# Patient Record
Sex: Female | Born: 1990 | Race: White | Hispanic: No | Marital: Single | Smoking: Never smoker
Health system: Southern US, Community
[De-identification: ages and names within clinical notes are randomized; demographics above are authoritative.]

## PROBLEM LIST (undated history)

## (undated) DIAGNOSIS — R87629 Unspecified abnormal cytological findings in specimens from vagina: Secondary | ICD-10-CM

## (undated) HISTORY — PX: COLPOSCOPY: SHX161

## (undated) HISTORY — DX: Unspecified abnormal cytological findings in specimens from vagina: R87.629

---

## 2018-07-13 ENCOUNTER — Other Ambulatory Visit: Payer: Self-pay | Admitting: Nurse Practitioner

## 2018-07-13 ENCOUNTER — Ambulatory Visit
Admission: RE | Admit: 2018-07-13 | Discharge: 2018-07-13 | Disposition: A | Discharge status: Home / Self Care | Payer: No Typology Code available for payment source | Source: Ambulatory Visit | Attending: Nurse Practitioner | Admitting: Nurse Practitioner

## 2018-07-13 ENCOUNTER — Other Ambulatory Visit: Payer: Self-pay

## 2018-07-13 DIAGNOSIS — Z021 Encounter for pre-employment examination: Secondary | ICD-10-CM

## 2019-03-08 ENCOUNTER — Other Ambulatory Visit: Payer: No Typology Code available for payment source

## 2019-09-26 IMAGING — CR CHEST  1 VIEW
1 series · 1 of 1 positions shown · non-contrast
Comparison: None.

CLINICAL DATA: Pre-employment examination

EXAM:
CHEST  1 VIEW

[w chest pa]
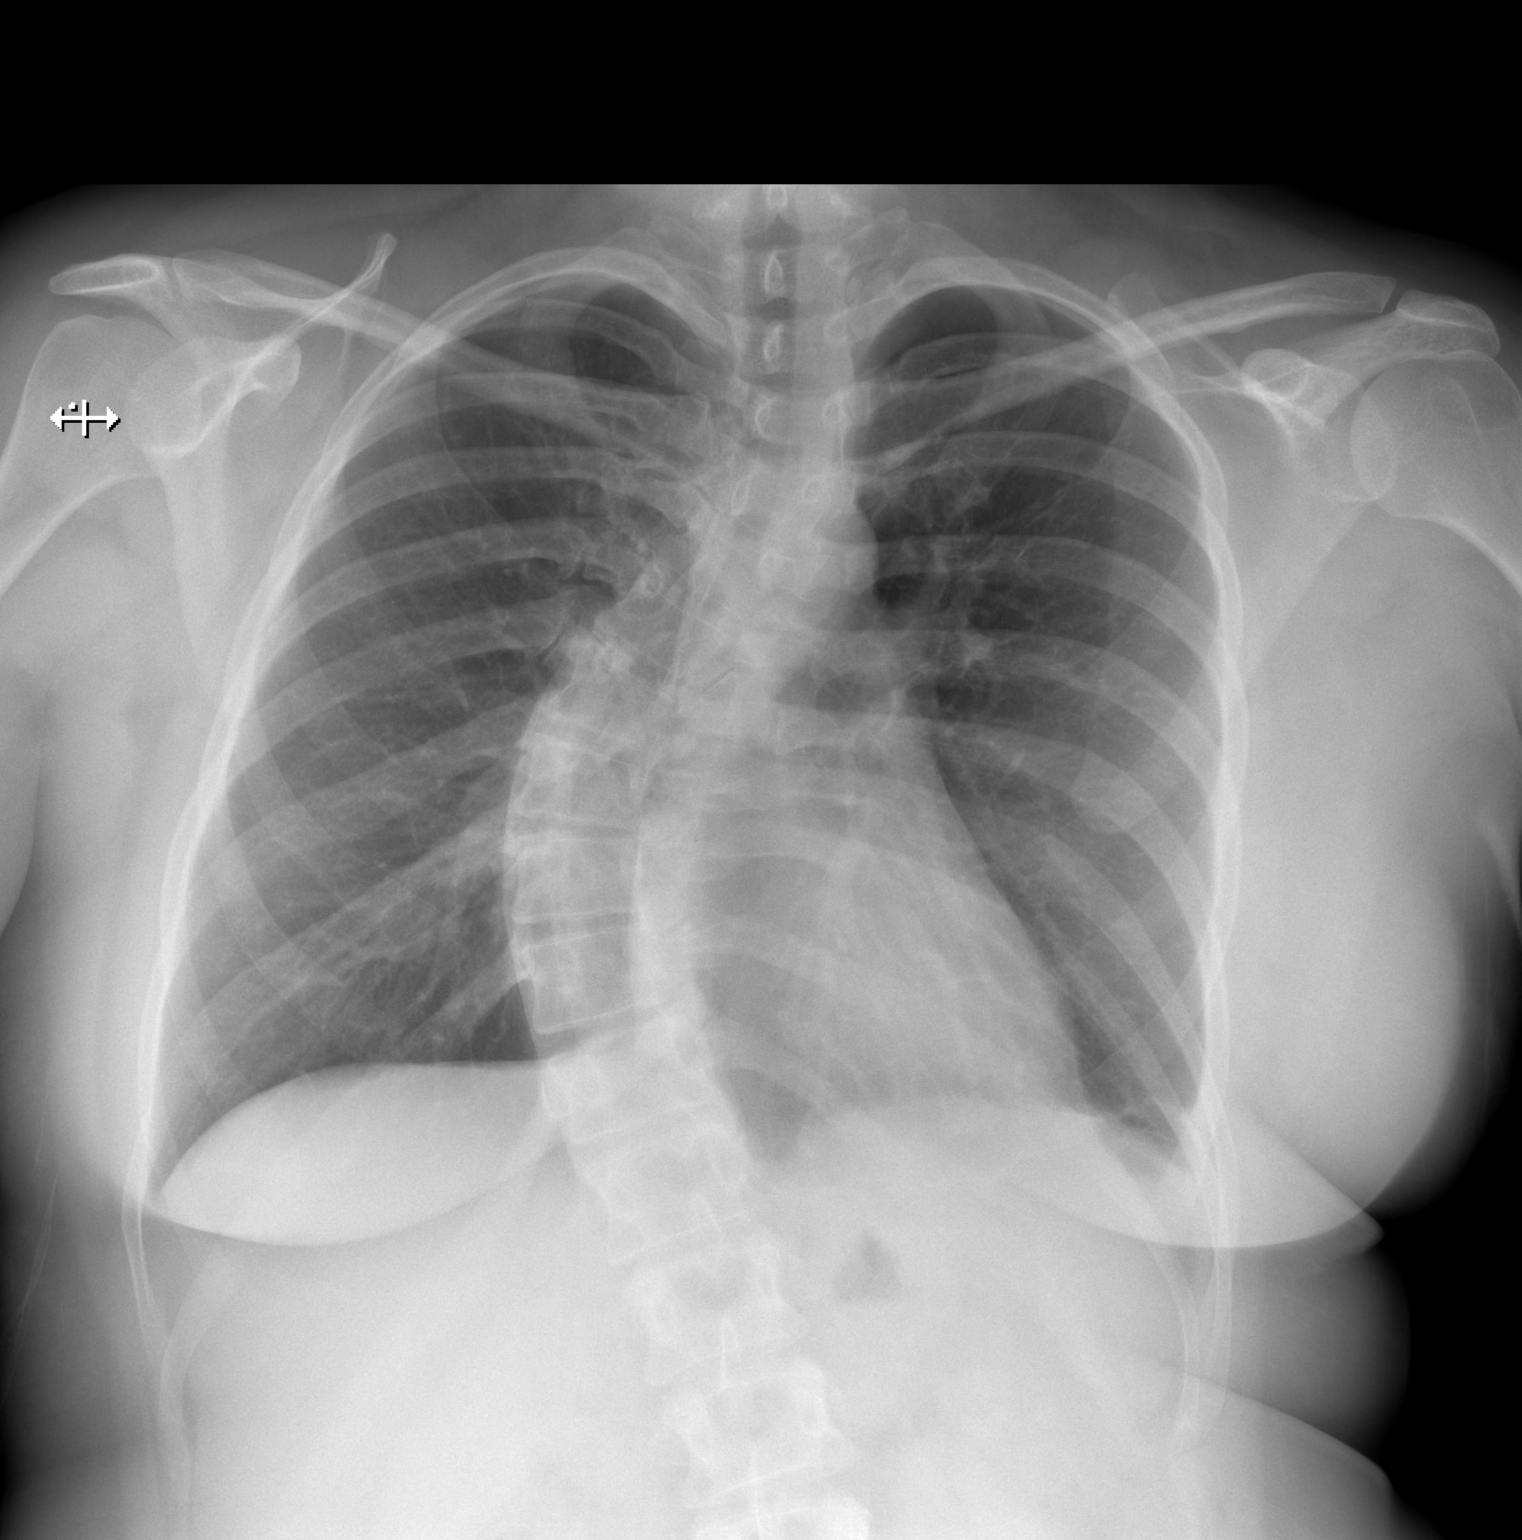

[1 of 1 positions shown; findings below may reference images not displayed]

FINDINGS: No focal opacity or pleural effusion. Normal heart size. No
pneumothorax. Moderate-to-marked dextroscoliosis of the spine.
IMPRESSION: No active disease.  Thoracic scoliosis

## 2020-02-28 ENCOUNTER — Other Ambulatory Visit: Payer: Self-pay

## 2020-02-28 ENCOUNTER — Other Ambulatory Visit (HOSPITAL_COMMUNITY)
Admission: RE | Admit: 2020-02-28 | Discharge: 2020-02-28 | Disposition: A | Discharge status: Home / Self Care | Payer: 59 | Source: Ambulatory Visit | Attending: Advanced Practice Midwife | Admitting: Advanced Practice Midwife

## 2020-02-28 ENCOUNTER — Ambulatory Visit (INDEPENDENT_AMBULATORY_CARE_PROVIDER_SITE_OTHER): Payer: 59 | Admitting: Advanced Practice Midwife

## 2020-02-28 ENCOUNTER — Encounter: Payer: Self-pay | Admitting: Advanced Practice Midwife

## 2020-02-28 VITALS — BP 112/74 | HR 84 | Ht 63.0 in | Wt 183.0 lb

## 2020-02-28 DIAGNOSIS — Z01419 Encounter for gynecological examination (general) (routine) without abnormal findings: Secondary | ICD-10-CM | POA: Diagnosis not present

## 2020-02-28 DIAGNOSIS — Z793 Long term (current) use of hormonal contraceptives: Secondary | ICD-10-CM | POA: Diagnosis not present

## 2020-02-28 DIAGNOSIS — N946 Dysmenorrhea, unspecified: Secondary | ICD-10-CM

## 2020-02-28 MED ORDER — NORGESTIMATE-ETH ESTRADIOL 0.25-35 MG-MCG PO TABS: 1.0000 | ORAL_TABLET | Freq: Every day | ORAL | 0 refills | Status: DC

## 2020-02-28 NOTE — Progress Notes (Signed)
Last pap around 11 years   Has heavy cycles not interested in birth control

## 2020-02-28 NOTE — Progress Notes (Signed)
GYNECOLOGY PROBLEM CARE ENCOUNTER NOTE  History:     Zarinah Condron is a 30 y.o. [id number] female here to establish care and receive information about heavy and painful periods.  Current complaints: 4 days of bleeding with each cycle, first two days consistently "hell".  During that time she experiences fatigue, saturates her pad in about one hour, and bleeding is sometimes so heavy she cannot wear a tampon. She was previously advised to consider NSAIDs and contraception for management. Denies discharge, pelvic pain, problems with intercourse or other gynecologic concerns.    Long-term female partner, considering TTC but not sure of timing. S/p diagnosis of bipolar disorder 8-9 years ago but has not been on medication in 4-5.    Gynecologic History Patient's last menstrual period was 02/11/2020 (exact date). Contraception: condoms Last Pap: 2011 per patient. Results were: normal with negative HPV  Obstetric History OB History  Gravida Para Term Preterm AB Living  1       1    SAB IAB Ectopic Multiple Live Births    1          # Outcome Date GA Lbr Len/2nd Weight Sex Delivery Anes PTL Lv  1 IAB 2011            Past Medical History:  Diagnosis Date  . Vaginal Pap smear, abnormal     Past Surgical History:  Procedure Laterality Date  . COLPOSCOPY      No current outpatient medications on file prior to visit.   No current facility-administered medications on file prior to visit.    No Known Allergies  Social History:  reports that she has never smoked. She has never used smokeless tobacco. She reports previous alcohol use. She reports previous drug use.  Family History  Problem Relation Age of Onset  . Breast cancer Paternal Grandmother   . Breast cancer Maternal Grandmother   . Ovarian cancer Mother     The following portions of the patient's history were reviewed and updated as appropriate: allergies, current medications, past family history, past medical  history, past social history, past surgical history and problem list.  Review of Systems Pertinent items noted in HPI and remainder of comprehensive ROS otherwise negative.  Physical Exam:  BP 112/74   Pulse 84   Ht 5\' 3"  (1.6 m)   Wt 183 lb (83 kg)   LMP 02/11/2020 (Exact Date)   BMI 32.42 kg/m  CONSTITUTIONAL: Well-developed, well-nourished female in no acute distress.  HENT:  Normocephalic, atraumatic, External right and left ear normal.  EYES: Conjunctivae and EOM are normal. Pupils are equal, round, and reactive to light. No scleral icterus.  NECK: Normal range of motion, supple, no masses.  Normal thyroid.  SKIN: Skin is warm and dry. No rash noted. Not diaphoretic. No erythema. No pallor. MUSCULOSKELETAL: Normal range of motion. No tenderness.  No cyanosis, clubbing, or edema.   NEUROLOGIC: Alert and oriented to person, place, and time. Normal reflexes, muscle tone coordination.  PSYCHIATRIC: Normal mood and affect. Normal behavior. Normal judgment and thought content. CARDIOVASCULAR: Normal heart rate noted, regular rhythm RESPIRATORY: Clear to auscultation bilaterally. Effort and breath sounds normal, no problems with respiration noted. BREASTS: Symmetric in size. No masses, tenderness, skin changes, nipple drainage, or lymphadenopathy bilaterally. Performed in the presence of a chaperone. ABDOMEN: Soft, no distention noted.  No tenderness, rebound or guarding.  PELVIC: Normal appearing external genitalia and urethral meatus; normal appearing vaginal mucosa and cervix.  No abnormal discharge noted.  Pap smear obtained.  Normal uterine size, no other palpable masses, no uterine or adnexal tenderness.  Performed in the presence of a chaperone.   Assessment and Plan:    1. Well woman exam with routine gynecological exam - No concerning findings on physical exam - New to [facility], needs PCP. Referred to [place] [facility] - Cytology - PAP - CBC - TSH - Hemoglobin  A1c - Comprehensive metabolic panel - Lipid panel  2. Dysmenorrhea treated with oral contraceptive - Advised Motrin 800 mg q 8 hours if heavy bleeding - Reviewed contraception options for management of AUB/Dysmenorrhea - norgestimate-ethinyl estradiol (ORTHO-CYCLEN) 0.25-35 MG-MCG tablet; Take 1 tablet by mouth daily.  Dispense: 60 tablet; Refill: 0  Will follow up results of pap smear and manage accordingly. Routine preventative health maintenance measures emphasized. Please refer to After Visit Summary for other counseling recommendations.      Clayton Bibles, MSN, CNM Certified Nurse Midwife, [facility] for [employer], [facility] 02/28/20 3:33 PM

## 2020-02-29 LAB — COMPREHENSIVE METABOLIC PANEL
ALT: 24 IU/L (ref 0–32)
AST: 17 IU/L (ref 0–40)
Albumin/Globulin Ratio: 2.2 (ref 1.2–2.2)
Albumin: 4.6 g/dL (ref 3.8–4.8)
Alkaline Phosphatase: 51 IU/L (ref 44–121)
BUN/Creatinine Ratio: 12 (ref 9–23)
BUN: 9 mg/dL (ref 6–20)
Bilirubin Total: 0.3 mg/dL (ref 0.0–1.2)
CO2: 23 mmol/L (ref 20–29)
Calcium: 9.1 mg/dL (ref 8.7–10.2)
Chloride: 104 mmol/L (ref 96–106)
Creatinine, Ser: 0.75 mg/dL (ref 0.57–1.00)
GFR calc Af Amer: 123 mL/min/{1.73_m2} (ref >59)
GFR calc non Af Amer: 107 mL/min/{1.73_m2} (ref >59)
Globulin, Total: 2.1 g/dL (ref 1.5–4.5)
Glucose: 91 mg/dL (ref 65–99)
Potassium: 4.3 mmol/L (ref 3.5–5.2)
Sodium: 141 mmol/L (ref 134–144)
Total Protein: 6.7 g/dL (ref 6.0–8.5)

## 2020-02-29 LAB — LIPID PANEL
Chol/HDL Ratio: 3.5 ratio (ref 0.0–4.4)
Cholesterol, Total: 189 mg/dL (ref 100–199)
HDL: 54 mg/dL (ref >39)
LDL Chol Calc (NIH): 117 mg/dL — ABNORMAL HIGH (ref 0–99)
Triglycerides: 99 mg/dL (ref 0–149)
VLDL Cholesterol Cal: 18 mg/dL (ref 5–40)

## 2020-02-29 LAB — CBC
Hematocrit: 39.8 % (ref 34.0–46.6)
Hemoglobin: 13.3 g/dL (ref 11.1–15.9)
MCH: 30.2 pg (ref 26.6–33.0)
MCHC: 33.4 g/dL (ref 31.5–35.7)
MCV: 90 fL (ref 79–97)
Platelets: 360 10*3/uL (ref 150–450)
RBC: 4.41 x10E6/uL (ref 3.77–5.28)
RDW: 12.3 % (ref 11.7–15.4)
WBC: 8.2 10*3/uL (ref 3.4–10.8)

## 2020-02-29 LAB — TSH: TSH: 0.679 u[IU]/mL (ref 0.450–4.500)

## 2020-02-29 LAB — HEMOGLOBIN A1C
Est. average glucose Bld gHb Est-mCnc: 103 mg/dL
Hgb A1c MFr Bld: 5.2 % (ref 4.8–5.6)

## 2020-03-04 LAB — CYTOLOGY - PAP
Comment: NEGATIVE
Diagnosis: NEGATIVE
High risk HPV: NEGATIVE

## 2020-04-16 ENCOUNTER — Encounter: Payer: Self-pay | Admitting: Radiology

## 2020-04-17 ENCOUNTER — Ambulatory Visit (INDEPENDENT_AMBULATORY_CARE_PROVIDER_SITE_OTHER): Payer: 59 | Admitting: *Deleted

## 2020-04-17 ENCOUNTER — Other Ambulatory Visit: Payer: Self-pay

## 2020-04-17 VITALS — BP 113/77 | HR 90

## 2020-04-17 DIAGNOSIS — R3915 Urgency of urination: Secondary | ICD-10-CM

## 2020-04-17 DIAGNOSIS — N3000 Acute cystitis without hematuria: Secondary | ICD-10-CM

## 2020-04-17 LAB — POCT URINALYSIS DIPSTICK: Nitrite, UA: POSITIVE

## 2020-04-17 MED ORDER — SULFAMETHOXAZOLE-TRIMETHOPRIM 800-160 MG PO TABS
1.0000 | ORAL_TABLET | Freq: Two times a day (BID) | ORAL | 0 refills | Status: DC
Start: 2020-04-17 — End: 2020-05-30

## 2020-04-17 NOTE — Progress Notes (Signed)
Attestation of Attending Supervision of clinical support staff: I agree with the care provided to this patient and was available for any consultation.  I have reviewed the RN's note and chart. I was available for consult and to see the patient if needed.   Kimberly Niles Newton, MD, MPH, ABFM Attending Physician [facility]  

## 2020-04-17 NOTE — Progress Notes (Signed)
SUBJECTIVE: Suzanne Gonzales is a 30 y.o. female who complains of urinary frequency, urgency and dysuria x 7 days, without flank pain, fever, chills, or abnormal vaginal discharge or bleeding.   OBJECTIVE: Appears well, in no apparent distress.  Vital signs are normal. Urine dipstick shows positive for RBC's, positive for nitrates and positive for leukocytes.    ASSESSMENT: Dysuria  PLAN: Treatment per orders.  Call or return to clinic prn if these symptoms worsen or fail to improve as anticipated.

## 2020-04-20 LAB — URINE CULTURE

## 2020-04-22 ENCOUNTER — Telehealth: Payer: Self-pay | Admitting: *Deleted

## 2020-04-22 MED ORDER — CIPROFLOXACIN HCL 500 MG PO TABS: 500.0000 mg | ORAL_TABLET | Freq: Two times a day (BID) | ORAL | 0 refills | Status: DC

## 2020-04-22 NOTE — Addendum Note (Signed)
Addended by: Geanie Berlin on: 04/22/2020 12:15 PM   Modules accepted: Orders

## 2020-04-22 NOTE — Telephone Encounter (Signed)
Pt informed of results and new medication sent in.

## 2020-04-22 NOTE — Telephone Encounter (Signed)
-----   Message from Federico Flake, MD sent at 04/22/2020 12:15 PM EST ----- Patient placed on Bactrim empirically at office visit. Culture shows resistance to bactrim. Sensitive to cipro. Sent in Rx.

## 2020-04-30 ENCOUNTER — Other Ambulatory Visit: Payer: Self-pay | Admitting: *Deleted

## 2020-04-30 DIAGNOSIS — N946 Dysmenorrhea, unspecified: Secondary | ICD-10-CM

## 2020-04-30 DIAGNOSIS — Z793 Long term (current) use of hormonal contraceptives: Secondary | ICD-10-CM

## 2020-04-30 MED ORDER — NORGESTIMATE-ETH ESTRADIOL 0.25-35 MG-MCG PO TABS: 1.0000 | ORAL_TABLET | Freq: Every day | ORAL | 0 refills | Status: DC

## 2020-05-30 ENCOUNTER — Other Ambulatory Visit: Payer: Self-pay

## 2020-05-30 ENCOUNTER — Ambulatory Visit (INDEPENDENT_AMBULATORY_CARE_PROVIDER_SITE_OTHER): Payer: 59 | Admitting: Family Medicine

## 2020-05-30 ENCOUNTER — Encounter: Payer: Self-pay | Admitting: Family Medicine

## 2020-05-30 VITALS — BP 109/75 | HR 78 | Ht 63.0 in | Wt 185.8 lb

## 2020-05-30 DIAGNOSIS — Z793 Long term (current) use of hormonal contraceptives: Secondary | ICD-10-CM

## 2020-05-30 DIAGNOSIS — N946 Dysmenorrhea, unspecified: Secondary | ICD-10-CM | POA: Diagnosis not present

## 2020-05-30 DIAGNOSIS — F419 Anxiety disorder, unspecified: Secondary | ICD-10-CM | POA: Diagnosis not present

## 2020-05-30 MED ORDER — PROPRANOLOL HCL 20 MG PO TABS: 20.0000 mg | ORAL_TABLET | Freq: Two times a day (BID) | ORAL | 3 refills | Status: AC

## 2020-05-30 NOTE — Assessment & Plan Note (Signed)
Has test taking anxiety--given refill for propranolol.

## 2020-05-30 NOTE — Assessment & Plan Note (Signed)
Improved, and will continue. If we do not get the desired effect, can consider skipping placebos.

## 2020-05-30 NOTE — Progress Notes (Signed)
   Subjective:    Patient ID: Suzanne Gonzales is a 30 y.o. female presenting with Follow-up  on 05/30/2020  HPI: Patient with pelvic pain and dysmenorrhea. Placed on OCs. Reports that pain is improving every month. Cycles are short and improving.  Review of Systems  Constitutional: Negative for chills and fever.  Respiratory: Negative for shortness of breath.   Cardiovascular: Negative for chest pain.  Gastrointestinal: Negative for abdominal pain, nausea and vomiting.  Genitourinary: Negative for dysuria.  Skin: Negative for rash.      Objective:    BP 109/75   Pulse 78   Ht 5\' 3"  (1.6 m)   Wt 185 lb 12.8 oz (84.3 kg)   BMI 32.91 kg/m  Physical Exam Constitutional:      General: She is not in acute distress.    Appearance: She is well-developed.  HENT:     Head: Normocephalic and atraumatic.  Eyes:     General: No scleral icterus. Cardiovascular:     Rate and Rhythm: Normal rate.  Pulmonary:     Effort: Pulmonary effort is normal.  Abdominal:     Palpations: Abdomen is soft.  Musculoskeletal:     Cervical back: Neck supple.  Skin:    General: Skin is warm and dry.  Neurological:     Mental Status: She is alert and oriented to person, place, and time.         Assessment & Plan:   Problem List Items Addressed This Visit      Unprioritized   Dysmenorrhea treated with oral contraceptive    Improved, and will continue. If we do not get the desired effect, can consider skipping placebos.      Anxiety - Primary    Has test taking anxiety--given refill for propranolol.      Relevant Medications   propranolol (INDERAL) 20 MG tablet      Return in about 3 months (around 08/29/2020).  10/30/2020 05/30/2020 4:50 PM

## 2020-06-10 ENCOUNTER — Other Ambulatory Visit: Payer: Self-pay | Admitting: *Deleted

## 2020-06-10 DIAGNOSIS — N946 Dysmenorrhea, unspecified: Secondary | ICD-10-CM

## 2020-06-10 MED ORDER — NORGESTIMATE-ETH ESTRADIOL 0.25-35 MG-MCG PO TABS: 1.0000 | ORAL_TABLET | Freq: Every day | ORAL | 5 refills | Status: DC

## 2020-07-16 ENCOUNTER — Encounter: Payer: Self-pay | Admitting: Radiology

## 2021-01-03 ENCOUNTER — Encounter: Payer: Self-pay | Admitting: Nurse Practitioner

## 2021-01-03 ENCOUNTER — Telehealth (INDEPENDENT_AMBULATORY_CARE_PROVIDER_SITE_OTHER): Payer: 59 | Admitting: Nurse Practitioner

## 2021-01-03 VITALS — Wt 188.0 lb

## 2021-01-03 DIAGNOSIS — Z7689 Persons encountering health services in other specified circumstances: Secondary | ICD-10-CM

## 2021-01-03 DIAGNOSIS — E669 Obesity, unspecified: Secondary | ICD-10-CM

## 2021-01-03 NOTE — Progress Notes (Signed)
Wt 188 lb (85.3 kg)   LMP 12/01/2020   BMI 33.30 kg/m    Subjective:    Patient ID: Suzanne Gonzales, female    DOB: [birth year], 30 y.o.   MRN: [id number]  HPI: Suzanne Gonzales is a 30 y.o. female  Chief Complaint  Patient presents with   New Patient (Initial Visit)   Patient presents to clinic to establish care with new PCP.  Patient denies any significant past medical history.   Patient denies a history of: Hypertension, Elevated Cholesterol, Diabetes, Thyroid problems, Depression, Anxiety, Neurological problems, and Abdominal problems.   Patient states she would like to discuss her weight.  She would like to discuss medication options for weight loss.    Denies HA, CP, SOB, dizziness, palpitations, visual changes, and lower extremity swelling.   Active Ambulatory Problems    Diagnosis Date Noted   Dysmenorrhea treated with oral contraceptive 05/30/2020   Anxiety 05/30/2020   Obesity (BMI 30-39.9) 01/03/2021   Resolved Ambulatory Problems    Diagnosis Date Noted   No Resolved Ambulatory Problems   Past Medical History:  Diagnosis Date   Vaginal Pap smear, abnormal    Past Surgical History:  Procedure Laterality Date   COLPOSCOPY     Family History  Problem Relation Age of Onset   Ovarian cancer Mother    Breast cancer Maternal Grandmother    Breast cancer Paternal Grandmother     Allergies and medications reviewed and updated.  Review of Systems  Eyes:  Negative for visual disturbance.  Respiratory:  Negative for cough, chest tightness and shortness of breath.   Cardiovascular:  Negative for chest pain, palpitations and leg swelling.  Neurological:  Negative for dizziness and headaches.   Per HPI unless specifically indicated above     Objective:    Wt 188 lb (85.3 kg)   LMP 12/01/2020   BMI 33.30 kg/m   Wt Readings from Last 3 Encounters:  01/03/21 188 lb (85.3 kg)  05/30/20 185 lb 12.8 oz (84.3 kg)  02/28/20 183 lb (83 kg)     Physical Exam Vitals and nursing note reviewed.  HENT:     Head: Normocephalic.     Right Ear: Hearing normal.     Left Ear: Hearing normal.     Nose: Nose normal.  Eyes:     Pupils: Pupils are equal, round, and reactive to light.  Pulmonary:     Effort: Pulmonary effort is normal. No respiratory distress.  Neurological:     Mental Status: She is alert.  Psychiatric:        Mood and Affect: Mood normal.        Behavior: Behavior normal.        Thought Content: Thought content normal.        Judgment: Judgment normal.    Results for orders placed or performed in visit on 04/17/20  Urine Culture   Specimen: Urine   UR  Result Value Ref Range   Urine Culture, Routine Final report (A)    Organism ID, Bacteria Comment (A)    Antimicrobial Susceptibility Comment   POCT Urinalysis Dipstick  Result Value Ref Range   Color, UA     Clarity, UA     Glucose, UA     Bilirubin, UA     Ketones, UA     Spec Grav, UA     Blood, UA moderate    pH, UA     Protein, UA     Urobilinogen, UA  Nitrite, UA positive    Leukocytes, UA Large (3+) (A) Negative   Appearance     Odor        Assessment & Plan:   Problem List Items Addressed This Visit       Other   Obesity (BMI 30-39.9) - Primary    Chronic.  Will order labs prior discussing medication options. Will follow up in 1 week to discuss lab results and medication options for weight loss.        Other Visit Diagnoses     Encounter for weight management       Relevant Orders   Comp Met (CMET)   CBC w/Diff   TSH   Encounter to establish care            Follow up plan: Return in about 1 week (around 01/10/2021) for Weight Managment.   A total of 30 minutes were spent on this encounter today.  When total time is documented, this includes both the face-to-face and non-face-to-face time personally spent before, during and after the visit on the date of the encounter discussing medical history and weight loss plan of  care..    This visit was completed via MyChart due to the restrictions of the COVID-19 pandemic. All issues as above were discussed and addressed. Physical exam was done as above through visual confirmation on MyChart. If it was felt that the patient should be evaluated in the office, they were directed there. The patient verbally consented to this visit. Location of the patient: Home Location of the provider: Office Those involved with this call:  Provider: Jon Billings, NP CMA: Jodie Echevaria, [place] Desk/Registration: Myrlene Broker This encounter was conducted via video.  I spent 20 dedicated to the care of this patient on the date of this encounter to include previsit review of 30, face to face time with the patient, and post visit ordering of testing.

## 2021-01-03 NOTE — Assessment & Plan Note (Signed)
Chronic.  Will order labs prior discussing medication options. Will follow up in 1 week to discuss lab results and medication options for weight loss.

## 2021-01-06 NOTE — Progress Notes (Signed)
LMOM for pt to call the office to schedule a lab appt at the beginning of the week and a fu for a week.

## 2021-01-07 NOTE — Progress Notes (Signed)
Appointment scheduled.

## 2021-01-13 ENCOUNTER — Other Ambulatory Visit: Payer: Self-pay

## 2021-01-13 ENCOUNTER — Other Ambulatory Visit: Payer: 59

## 2021-01-13 DIAGNOSIS — Z7689 Persons encountering health services in other specified circumstances: Secondary | ICD-10-CM

## 2021-01-14 LAB — CBC WITH DIFFERENTIAL/PLATELET
Basophils Absolute: 0.1 10*3/uL (ref 0.0–0.2)
Basos: 1 %
EOS (ABSOLUTE): 0.2 10*3/uL (ref 0.0–0.4)
Eos: 2 %
Hematocrit: 39.4 % (ref 34.0–46.6)
Hemoglobin: 12.9 g/dL (ref 11.1–15.9)
Immature Grans (Abs): 0 10*3/uL (ref 0.0–0.1)
Immature Granulocytes: 0 %
Lymphocytes Absolute: 3.4 10*3/uL — ABNORMAL HIGH (ref 0.7–3.1)
Lymphs: 37 %
MCH: 29.3 pg (ref 26.6–33.0)
MCHC: 32.7 g/dL (ref 31.5–35.7)
MCV: 90 fL (ref 79–97)
Monocytes Absolute: 0.7 10*3/uL (ref 0.1–0.9)
Monocytes: 8 %
Neutrophils Absolute: 5 10*3/uL (ref 1.4–7.0)
Neutrophils: 52 %
Platelets: 347 10*3/uL (ref 150–450)
RBC: 4.4 x10E6/uL (ref 3.77–5.28)
RDW: 12.4 % (ref 11.7–15.4)
WBC: 9.4 10*3/uL (ref 3.4–10.8)

## 2021-01-14 LAB — COMPREHENSIVE METABOLIC PANEL
ALT: 33 IU/L — ABNORMAL HIGH (ref 0–32)
AST: 19 IU/L (ref 0–40)
Albumin/Globulin Ratio: 2.2 (ref 1.2–2.2)
Albumin: 4.3 g/dL (ref 3.8–4.8)
Alkaline Phosphatase: 65 IU/L (ref 44–121)
BUN/Creatinine Ratio: 19 (ref 9–23)
BUN: 13 mg/dL (ref 6–20)
Bilirubin Total: 0.4 mg/dL (ref 0.0–1.2)
CO2: 23 mmol/L (ref 20–29)
Calcium: 8.9 mg/dL (ref 8.7–10.2)
Chloride: 101 mmol/L (ref 96–106)
Creatinine, Ser: 0.67 mg/dL (ref 0.57–1.00)
Globulin, Total: 2 g/dL (ref 1.5–4.5)
Glucose: 84 mg/dL (ref 70–99)
Potassium: 4.3 mmol/L (ref 3.5–5.2)
Sodium: 138 mmol/L (ref 134–144)
Total Protein: 6.3 g/dL (ref 6.0–8.5)
eGFR: 119 mL/min/{1.73_m2} (ref >59)

## 2021-01-14 LAB — TSH: TSH: 1.25 u[IU]/mL (ref 0.450–4.500)

## 2021-01-14 NOTE — Progress Notes (Signed)
Hi Avonna.  Your blood work looks good.  Please let me know if you have any questions.  I will see you soon at our appt.

## 2021-01-22 ENCOUNTER — Encounter: Payer: Self-pay | Admitting: Nurse Practitioner

## 2021-01-22 ENCOUNTER — Other Ambulatory Visit: Payer: Self-pay

## 2021-01-22 ENCOUNTER — Ambulatory Visit (INDEPENDENT_AMBULATORY_CARE_PROVIDER_SITE_OTHER): Payer: 59 | Admitting: Nurse Practitioner

## 2021-01-22 VITALS — BP 112/59 | HR 70 | Temp 97.8°F | Wt 192.0 lb

## 2021-01-22 DIAGNOSIS — Z7689 Persons encountering health services in other specified circumstances: Secondary | ICD-10-CM

## 2021-01-22 DIAGNOSIS — E669 Obesity, unspecified: Secondary | ICD-10-CM

## 2021-01-22 DIAGNOSIS — F419 Anxiety disorder, unspecified: Secondary | ICD-10-CM | POA: Diagnosis not present

## 2021-01-22 MED ORDER — NALTREXONE-BUPROPION HCL ER 8-90 MG PO TB12: ORAL_TABLET | ORAL | 0 refills | Status: DC

## 2021-01-22 NOTE — Progress Notes (Signed)
BP (!) 112/59   Pulse 70   Temp 97.8 F (36.6 C) (Oral)   Wt 192 lb (87.1 kg)   SpO2 99%   BMI 34.01 kg/m    Subjective:    Patient ID: Suzanne Gonzales, female    DOB: 05-30-1990, 30 y.o.   MRN: [id number]  HPI: Suzanne Gonzales is a 30 y.o. female  Chief Complaint  Patient presents with   Weight Check    WEIGHT MANAGEMENT Patient presents to clinic to start weight loss medication.  Patient has tried diet and exercising on her own and has not been successful in losing weight. She has previously completed blood work prior to starting medication.    Denies HA, CP, SOB, dizziness, palpitations, visual changes, and lower extremity swelling.   Relevant past medical, surgical, family and social history reviewed and updated as indicated. Interim medical history since our last visit reviewed. Allergies and medications reviewed and updated.  Review of Systems  Constitutional:  Positive for unexpected weight change.  Eyes:  Negative for visual disturbance.  Respiratory:  Negative for cough, chest tightness and shortness of breath.   Cardiovascular:  Negative for chest pain, palpitations and leg swelling.  Neurological:  Negative for dizziness and headaches.   Per HPI unless specifically indicated above     Objective:    BP (!) 112/59   Pulse 70   Temp 97.8 F (36.6 C) (Oral)   Wt 192 lb (87.1 kg)   SpO2 99%   BMI 34.01 kg/m   Wt Readings from Last 3 Encounters:  01/22/21 192 lb (87.1 kg)  01/03/21 188 lb (85.3 kg)  05/30/20 185 lb 12.8 oz (84.3 kg)    Physical Exam Vitals and nursing note reviewed.  Constitutional:      General: She is not in acute distress.    Appearance: Normal appearance. She is obese. She is not ill-appearing, toxic-appearing or diaphoretic.  HENT:     Head: Normocephalic.     Right Ear: External ear normal.     Left Ear: External ear normal.     Nose: Nose normal.     Mouth/Throat:     Mouth: Mucous membranes are moist.      Pharynx: Oropharynx is clear.  Eyes:     General:        Right eye: No discharge.        Left eye: No discharge.     Extraocular Movements: Extraocular movements intact.     Conjunctiva/sclera: Conjunctivae normal.     Pupils: Pupils are equal, round, and reactive to light.  Cardiovascular:     Rate and Rhythm: Normal rate and regular rhythm.     Heart sounds: No murmur heard. Pulmonary:     Effort: Pulmonary effort is normal. No respiratory distress.     Breath sounds: Normal breath sounds. No wheezing or rales.  Musculoskeletal:     Cervical back: Normal range of motion and neck supple.  Skin:    General: Skin is warm and dry.     Capillary Refill: Capillary refill takes less than 2 seconds.  Neurological:     General: No focal deficit present.     Mental Status: She is alert and oriented to person, place, and time. Mental status is at baseline.  Psychiatric:        Mood and Affect: Mood normal.        Behavior: Behavior normal.        Thought Content: Thought content normal.  Judgment: Judgment normal.    Results for orders placed or performed in visit on 01/13/21  TSH  Result Value Ref Range   TSH 1.250 0.450 - 4.500 uIU/mL  CBC w/Diff  Result Value Ref Range   WBC 9.4 3.4 - 10.8 x10E3/uL   RBC 4.40 3.77 - 5.28 x10E6/uL   Hemoglobin 12.9 11.1 - 15.9 g/dL   Hematocrit 39.4 34.0 - 46.6 %   MCV 90 79 - 97 fL   MCH 29.3 26.6 - 33.0 pg   MCHC 32.7 31.5 - 35.7 g/dL   RDW 12.4 11.7 - 15.4 %   Platelets 347 150 - 450 x10E3/uL   Neutrophils 52 Not Estab. %   Lymphs 37 Not Estab. %   Monocytes 8 Not Estab. %   Eos 2 Not Estab. %   Basos 1 Not Estab. %   Neutrophils Absolute 5.0 1.4 - 7.0 x10E3/uL   Lymphocytes Absolute 3.4 (H) 0.7 - 3.1 x10E3/uL   Monocytes Absolute 0.7 0.1 - 0.9 x10E3/uL   EOS (ABSOLUTE) 0.2 0.0 - 0.4 x10E3/uL   Basophils Absolute 0.1 0.0 - 0.2 x10E3/uL   Immature Granulocytes 0 Not Estab. %   Immature Grans (Abs) 0.0 0.0 - 0.1 x10E3/uL  Comp  Met (CMET)  Result Value Ref Range   Glucose 84 70 - 99 mg/dL   BUN 13 6 - 20 mg/dL   Creatinine, Ser 0.67 0.57 - 1.00 mg/dL   eGFR 119 >59 mL/min/1.73   BUN/Creatinine Ratio 19 9 - 23   Sodium 138 134 - 144 mmol/L   Potassium 4.3 3.5 - 5.2 mmol/L   Chloride 101 96 - 106 mmol/L   CO2 23 20 - 29 mmol/L   Calcium 8.9 8.7 - 10.2 mg/dL   Total Protein 6.3 6.0 - 8.5 g/dL   Albumin 4.3 3.8 - 4.8 g/dL   Globulin, Total 2.0 1.5 - 4.5 g/dL   Albumin/Globulin Ratio 2.2 1.2 - 2.2   Bilirubin Total 0.4 0.0 - 1.2 mg/dL   Alkaline Phosphatase 65 44 - 121 IU/L   AST 19 0 - 40 IU/L   ALT 33 (H) 0 - 32 IU/L      Assessment & Plan:   Problem List Items Addressed This Visit       Other   Anxiety   Obesity (BMI 30-39.9) - Primary    Will start Contrave for weight loss. Patient would like to try pill option first.  Discussed how to properly use medication and how to titrate.  Discussed side effects and benefits of medication.  Discussed that it is best coupled with healthy diet and exercise habits.  Follow up in 1 month for reevaluation.  Patient understands and agrees with the plan of care.       Relevant Medications   Naltrexone-buPROPion HCl ER 8-90 MG TB12   Other Visit Diagnoses     Encounter for weight management            Follow up plan: Return in about 1 month (around 02/21/2021) for Weight Managment.

## 2021-01-22 NOTE — Assessment & Plan Note (Signed)
Will start Contrave for weight loss. Patient would like to try pill option first.  Discussed how to properly use medication and how to titrate.  Discussed side effects and benefits of medication.  Discussed that it is best coupled with healthy diet and exercise habits.  Follow up in 1 month for reevaluation.  Patient understands and agrees with the plan of care.

## 2021-01-23 ENCOUNTER — Encounter: Payer: Self-pay | Admitting: Radiology

## 2021-02-06 ENCOUNTER — Encounter: Payer: Self-pay | Admitting: Nurse Practitioner

## 2021-02-19 ENCOUNTER — Ambulatory Visit (INDEPENDENT_AMBULATORY_CARE_PROVIDER_SITE_OTHER): Payer: 59 | Admitting: Nurse Practitioner

## 2021-02-19 ENCOUNTER — Encounter: Payer: Self-pay | Admitting: Nurse Practitioner

## 2021-02-19 ENCOUNTER — Other Ambulatory Visit: Payer: Self-pay

## 2021-02-19 VITALS — BP 109/71 | HR 86 | Temp 98.2°F | Wt 188.6 lb

## 2021-02-19 DIAGNOSIS — E669 Obesity, unspecified: Secondary | ICD-10-CM

## 2021-02-19 NOTE — Progress Notes (Signed)
BP 109/71    Pulse 86    Temp 98.2 F (36.8 C) (Oral)    Wt 188 lb 9.6 oz (85.5 kg)    SpO2 99%    BMI 33.41 kg/m    Subjective:    Patient ID: Suzanne Gonzales, female    DOB: Sep 07, 1990, 30 y.o.   MRN: [id number]  HPI: Suzanne Gonzales is a 30 y.o. female  Chief Complaint  Patient presents with   Obesity    1 month f/up     WEIGHT MANAGEMENT Patient presents to clinic to follow up in weight loss. Patient states the first 7-10 days was really good.  She had stopped craving sweets and a lot of food.  She feels like her cravings are coming back and she is hungrier. Patient states the headaches she was getting have improved.  She does have some dry mouth.    Denies HA, CP, SOB, dizziness, palpitations, visual changes, and lower extremity swelling.   Relevant past medical, surgical, family and social history reviewed and updated as indicated. Interim medical history since our last visit reviewed. Allergies and medications reviewed and updated.  Review of Systems  Constitutional:        Weight loss  Eyes:  Negative for visual disturbance.  Respiratory:  Negative for cough, chest tightness and shortness of breath.   Cardiovascular:  Negative for chest pain, palpitations and leg swelling.  Neurological:  Negative for dizziness and headaches.   Per HPI unless specifically indicated above     Objective:    BP 109/71    Pulse 86    Temp 98.2 F (36.8 C) (Oral)    Wt 188 lb 9.6 oz (85.5 kg)    SpO2 99%    BMI 33.41 kg/m   Wt Readings from Last 3 Encounters:  02/19/21 188 lb 9.6 oz (85.5 kg)  01/22/21 192 lb (87.1 kg)  01/03/21 188 lb (85.3 kg)    Physical Exam Vitals and nursing note reviewed.  Constitutional:      General: She is not in acute distress.    Appearance: Normal appearance. She is obese. She is not ill-appearing, toxic-appearing or diaphoretic.  HENT:     Head: Normocephalic.     Right Ear: External ear normal.     Left Ear: External ear normal.      Nose: Nose normal.     Mouth/Throat:     Mouth: Mucous membranes are moist.     Pharynx: Oropharynx is clear.  Eyes:     General:        Right eye: No discharge.        Left eye: No discharge.     Extraocular Movements: Extraocular movements intact.     Conjunctiva/sclera: Conjunctivae normal.     Pupils: Pupils are equal, round, and reactive to light.  Cardiovascular:     Rate and Rhythm: Normal rate and regular rhythm.     Heart sounds: No murmur heard. Pulmonary:     Effort: Pulmonary effort is normal. No respiratory distress.     Breath sounds: Normal breath sounds. No wheezing or rales.  Musculoskeletal:     Cervical back: Normal range of motion and neck supple.  Skin:    General: Skin is warm and dry.     Capillary Refill: Capillary refill takes less than 2 seconds.  Neurological:     General: No focal deficit present.     Mental Status: She is alert and oriented to person, place, and time. Mental  status is at baseline.  Psychiatric:        Mood and Affect: Mood normal.        Behavior: Behavior normal.        Thought Content: Thought content normal.        Judgment: Judgment normal.    Results for orders placed or performed in visit on 01/13/21  TSH  Result Value Ref Range   TSH 1.250 0.450 - 4.500 uIU/mL  CBC w/Diff  Result Value Ref Range   WBC 9.4 3.4 - 10.8 x10E3/uL   RBC 4.40 3.77 - 5.28 x10E6/uL   Hemoglobin 12.9 11.1 - 15.9 g/dL   Hematocrit 39.4 34.0 - 46.6 %   MCV 90 79 - 97 fL   MCH 29.3 26.6 - 33.0 pg   MCHC 32.7 31.5 - 35.7 g/dL   RDW 12.4 11.7 - 15.4 %   Platelets 347 150 - 450 x10E3/uL   Neutrophils 52 Not Estab. %   Lymphs 37 Not Estab. %   Monocytes 8 Not Estab. %   Eos 2 Not Estab. %   Basos 1 Not Estab. %   Neutrophils Absolute 5.0 1.4 - 7.0 x10E3/uL   Lymphocytes Absolute 3.4 (H) 0.7 - 3.1 x10E3/uL   Monocytes Absolute 0.7 0.1 - 0.9 x10E3/uL   EOS (ABSOLUTE) 0.2 0.0 - 0.4 x10E3/uL   Basophils Absolute 0.1 0.0 - 0.2 x10E3/uL   Immature  Granulocytes 0 Not Estab. %   Immature Grans (Abs) 0.0 0.0 - 0.1 x10E3/uL  Comp Met (CMET)  Result Value Ref Range   Glucose 84 70 - 99 mg/dL   BUN 13 6 - 20 mg/dL   Creatinine, Ser 0.67 0.57 - 1.00 mg/dL   eGFR 119 >59 mL/min/1.73   BUN/Creatinine Ratio 19 9 - 23   Sodium 138 134 - 144 mmol/L   Potassium 4.3 3.5 - 5.2 mmol/L   Chloride 101 96 - 106 mmol/L   CO2 23 20 - 29 mmol/L   Calcium 8.9 8.7 - 10.2 mg/dL   Total Protein 6.3 6.0 - 8.5 g/dL   Albumin 4.3 3.8 - 4.8 g/dL   Globulin, Total 2.0 1.5 - 4.5 g/dL   Albumin/Globulin Ratio 2.2 1.2 - 2.2   Bilirubin Total 0.4 0.0 - 1.2 mg/dL   Alkaline Phosphatase 65 44 - 121 IU/L   AST 19 0 - 40 IU/L   ALT 33 (H) 0 - 32 IU/L      Assessment & Plan:   Problem List Items Addressed This Visit       Other   Obesity (BMI 30-39.9) - Primary    Ongoing. Patient has lost 4lbs since starting the medication. She was able to increase to two tabs in the morning and two in the evening this past Sunday.  Praised for weight loss.  Encouraged to follow a diet and exercise regularly to aid in weight loss.  Follow up in 2 months for reevaluation.        Follow up plan: Return in about 2 months (around 04/22/2021) for Weight Managment.

## 2021-02-19 NOTE — Assessment & Plan Note (Signed)
Ongoing. Patient has lost 4lbs since starting the medication. She was able to increase to two tabs in the morning and two in the evening this past Sunday.  Praised for weight loss.  Encouraged to follow a diet and exercise regularly to aid in weight loss.  Follow up in 2 months for reevaluation.

## 2021-03-03 ENCOUNTER — Encounter: Payer: Self-pay | Admitting: Nurse Practitioner

## 2021-03-04 MED ORDER — NALTREXONE-BUPROPION HCL ER 8-90 MG PO TB12: ORAL_TABLET | ORAL | 0 refills | Status: DC

## 2021-03-30 ENCOUNTER — Encounter: Payer: Self-pay | Admitting: Nurse Practitioner

## 2021-04-25 ENCOUNTER — Other Ambulatory Visit: Payer: Self-pay | Admitting: Nurse Practitioner

## 2021-04-25 ENCOUNTER — Other Ambulatory Visit: Payer: Self-pay

## 2021-04-25 ENCOUNTER — Ambulatory Visit (INDEPENDENT_AMBULATORY_CARE_PROVIDER_SITE_OTHER): Payer: 59 | Admitting: Nurse Practitioner

## 2021-04-25 ENCOUNTER — Encounter: Payer: Self-pay | Admitting: Nurse Practitioner

## 2021-04-25 VITALS — BP 111/72 | HR 88 | Temp 98.1°F | Wt 184.2 lb

## 2021-04-25 DIAGNOSIS — E669 Obesity, unspecified: Secondary | ICD-10-CM | POA: Diagnosis not present

## 2021-04-25 MED ORDER — SEMAGLUTIDE-WEIGHT MANAGEMENT 2.4 MG/0.75ML ~~LOC~~ SOAJ: 2.4000 mg | SUBCUTANEOUS | 0 refills | Status: AC

## 2021-04-25 MED ORDER — SEMAGLUTIDE-WEIGHT MANAGEMENT 1 MG/0.5ML ~~LOC~~ SOAJ: 1.0000 mg | SUBCUTANEOUS | 0 refills | Status: AC

## 2021-04-25 MED ORDER — SEMAGLUTIDE-WEIGHT MANAGEMENT 1.7 MG/0.75ML ~~LOC~~ SOAJ: 1.7000 mg | SUBCUTANEOUS | 0 refills | Status: AC

## 2021-04-25 MED ORDER — SEMAGLUTIDE-WEIGHT MANAGEMENT 0.25 MG/0.5ML ~~LOC~~ SOAJ: 0.2500 mg | SUBCUTANEOUS | 0 refills | Status: AC

## 2021-04-25 MED ORDER — SEMAGLUTIDE-WEIGHT MANAGEMENT 0.5 MG/0.5ML ~~LOC~~ SOAJ: 0.5000 mg | SUBCUTANEOUS | 0 refills | Status: AC

## 2021-04-25 NOTE — Telephone Encounter (Signed)
Requested medication (s) are due for refill today no ? ?Requested medication (s) are on the active medication list -yes ? ?Future visit scheduled -yes ? ?Last refill: today ? ?Notes to clinic: Pharmacy message: not covered- requires PA, alternative ? ?Requested Prescriptions  ?Pending Prescriptions Disp Refills  ? WEGOVY 2.4 MG/0.75ML SOAJ [Pharmacy Med Name: WEGOVY 2.4 MG/0.75 ML PEN]  0  ?  Sig: INJECT 2.4 MG INTO THE SKIN ONCE A WEEK FOR 28 DAYS.  ?  ? Endocrinology:  Diabetes - GLP-1 Receptor Agonists - semaglutide Failed - 04/25/2021  3:23 PM  ?  ?  Failed - HBA1C in normal range and within 180 days  ?  Hgb A1c MFr Bld  ?Date Value Ref Range Status  ?02/28/2020 5.2 4.8 - 5.6 % Final  ?  Comment:  ?           Prediabetes: 5.7 - 6.4 ?         Diabetes: >6.4 ?         Glycemic control for adults with diabetes: <7.0 ?  ?  ?  ?  ?  Passed - Cr in normal range and within 360 days  ?  Creatinine, Ser  ?Date Value Ref Range Status  ?01/13/2021 0.67 0.57 - 1.00 mg/dL Final  ?  ?  ?  ?  Passed - Valid encounter within last 6 months  ?  Recent Outpatient Visits   ? ?      ? Today Obesity (BMI 30-39.9)  ? [facility] Larae Grooms, NP  ? 2 months ago Obesity (BMI 30-39.9)  ? [facility] Larae Grooms, NP  ? 3 months ago Obesity (BMI 30-39.9)  ? [facility] Larae Grooms, NP  ? 3 months ago Obesity (BMI 30-39.9)  ? [facility] Larae Grooms, NP  ? ?  ?  ?Future Appointments   ? ?        ? In 1 month Larae Grooms, NP [facility]  ? ?  ? ?  ?  ?  ? ? ? ?Requested Prescriptions  ?Pending Prescriptions Disp Refills  ? WEGOVY 2.4 MG/0.75ML SOAJ [Pharmacy Med Name: WEGOVY 2.4 MG/0.75 ML PEN]  0  ?  Sig: INJECT 2.4 MG INTO THE SKIN ONCE A WEEK FOR 28 DAYS.  ?  ? Endocrinology:  Diabetes - GLP-1 Receptor Agonists - semaglutide Failed - 04/25/2021  3:23 PM  ?  ?  Failed - HBA1C in normal range and within 180 days  ?  Hgb A1c MFr Bld  ?Date Value  Ref Range Status  ?02/28/2020 5.2 4.8 - 5.6 % Final  ?  Comment:  ?           Prediabetes: 5.7 - 6.4 ?         Diabetes: >6.4 ?         Glycemic control for adults with diabetes: <7.0 ?  ?  ?  ?  ?  Passed - Cr in normal range and within 360 days  ?  Creatinine, Ser  ?Date Value Ref Range Status  ?01/13/2021 0.67 0.57 - 1.00 mg/dL Final  ?  ?  ?  ?  Passed - Valid encounter within last 6 months  ?  Recent Outpatient Visits   ? ?      ? Today Obesity (BMI 30-39.9)  ? [facility] Larae Grooms, NP  ? 2 months ago Obesity (BMI 30-39.9)  ? [facility] Larae Grooms, NP  ?  3 months ago Obesity (BMI 30-39.9)  ? [facility] Larae Grooms, NP  ? 3 months ago Obesity (BMI 30-39.9)  ? [facility] Larae Grooms, NP  ? ?  ?  ?Future Appointments   ? ?        ? In 1 month Larae Grooms, NP [facility]  ? ?  ? ?  ?  ?  ? ? ? ?

## 2021-04-25 NOTE — Progress Notes (Signed)
? ?BP 111/72   Pulse 88   Temp 98.1 ?F (36.7 ?C) (Oral)   Wt 184 lb 3.2 oz (83.6 kg)   LMP 04/07/2021 (Exact Date)   SpO2 98%   BMI 32.63 kg/m?   ? ?Subjective:  ? ? Patient ID: Suzanne Gonzales, female    DOB: [birth year], 31 y.o.   MRN: [id number] ? ?HPI: ?Suzanne Gonzales is a 31 y.o. female ? ?Chief Complaint  ?Patient presents with  ? Obesity  ? ?WEIGHT MANAGEMENT ?Patient presents to clinic to follow up on weight management. Patient has lost 8lbs since she started the weight loss journey.  She is ready to try an injectable.  She had issues with the pharmacy getting the medication.  ? ?Relevant past medical, surgical, family and social history reviewed and updated as indicated. Interim medical history since our last visit reviewed. ?Allergies and medications reviewed and updated. ? ?Review of Systems  ?Constitutional:  Negative for unexpected weight change.  ? ?Per HPI unless specifically indicated above ? ?   ?Objective:  ?  ?BP 111/72   Pulse 88   Temp 98.1 ?F (36.7 ?C) (Oral)   Wt 184 lb 3.2 oz (83.6 kg)   LMP 04/07/2021 (Exact Date)   SpO2 98%   BMI 32.63 kg/m?   ?Wt Readings from Last 3 Encounters:  ?04/25/21 184 lb 3.2 oz (83.6 kg)  ?02/19/21 188 lb 9.6 oz (85.5 kg)  ?01/22/21 192 lb (87.1 kg)  ?  ?Physical Exam ?Vitals and nursing note reviewed.  ?Constitutional:   ?   General: She is not in acute distress. ?   Appearance: Normal appearance. She is normal weight. She is not ill-appearing, toxic-appearing or diaphoretic.  ?HENT:  ?   Head: Normocephalic.  ?   Right Ear: External ear normal.  ?   Left Ear: External ear normal.  ?   Nose: Nose normal.  ?   Mouth/Throat:  ?   Mouth: Mucous membranes are moist.  ?   Pharynx: Oropharynx is clear.  ?Eyes:  ?   General:     ?   Right eye: No discharge.     ?   Left eye: No discharge.  ?   Extraocular Movements: Extraocular movements intact.  ?   Conjunctiva/sclera: Conjunctivae normal.  ?   Pupils: Pupils are equal, round, and reactive to light.   ?Cardiovascular:  ?   Rate and Rhythm: Normal rate and regular rhythm.  ?   Heart sounds: No murmur heard. ?Pulmonary:  ?   Effort: Pulmonary effort is normal. No respiratory distress.  ?   Breath sounds: Normal breath sounds. No wheezing or rales.  ?Musculoskeletal:  ?   Cervical back: Normal range of motion and neck supple.  ?Skin: ?   General: Skin is warm and dry.  ?   Capillary Refill: Capillary refill takes less than 2 seconds.  ?Neurological:  ?   General: No focal deficit present.  ?   Mental Status: She is alert and oriented to person, place, and time. Mental status is at baseline.  ?Psychiatric:     ?   Mood and Affect: Mood normal.     ?   Behavior: Behavior normal.     ?   Thought Content: Thought content normal.     ?   Judgment: Judgment normal.  ? ? ?Results for orders placed or performed in visit on 01/13/21  ?TSH  ?Result Value Ref Range  ? TSH 1.250 0.450 - 4.500 uIU/mL  ?CBC  w/Diff  ?Result Value Ref Range  ? WBC 9.4 3.4 - 10.8 x10E3/uL  ? RBC 4.40 3.77 - 5.28 x10E6/uL  ? Hemoglobin 12.9 11.1 - 15.9 g/dL  ? Hematocrit 39.4 34.0 - 46.6 %  ? MCV 90 79 - 97 fL  ? MCH 29.3 26.6 - 33.0 pg  ? MCHC 32.7 31.5 - 35.7 g/dL  ? RDW 12.4 11.7 - 15.4 %  ? Platelets 347 150 - 450 x10E3/uL  ? Neutrophils 52 Not Estab. %  ? Lymphs 37 Not Estab. %  ? Monocytes 8 Not Estab. %  ? Eos 2 Not Estab. %  ? Basos 1 Not Estab. %  ? Neutrophils Absolute 5.0 1.4 - 7.0 x10E3/uL  ? Lymphocytes Absolute 3.4 (H) 0.7 - 3.1 x10E3/uL  ? Monocytes Absolute 0.7 0.1 - 0.9 x10E3/uL  ? EOS (ABSOLUTE) 0.2 0.0 - 0.4 x10E3/uL  ? Basophils Absolute 0.1 0.0 - 0.2 x10E3/uL  ? Immature Granulocytes 0 Not Estab. %  ? Immature Grans (Abs) 0.0 0.0 - 0.1 x10E3/uL  ?Comp Met (CMET)  ?Result Value Ref Range  ? Glucose 84 70 - 99 mg/dL  ? BUN 13 6 - 20 mg/dL  ? Creatinine, Ser 0.67 0.57 - 1.00 mg/dL  ? eGFR 119 >59 mL/min/1.73  ? BUN/Creatinine Ratio 19 9 - 23  ? Sodium 138 134 - 144 mmol/L  ? Potassium 4.3 3.5 - 5.2 mmol/L  ? Chloride 101 96 - 106  mmol/L  ? CO2 23 20 - 29 mmol/L  ? Calcium 8.9 8.7 - 10.2 mg/dL  ? Total Protein 6.3 6.0 - 8.5 g/dL  ? Albumin 4.3 3.8 - 4.8 g/dL  ? Globulin, Total 2.0 1.5 - 4.5 g/dL  ? Albumin/Globulin Ratio 2.2 1.2 - 2.2  ? Bilirubin Total 0.4 0.0 - 1.2 mg/dL  ? Alkaline Phosphatase 65 44 - 121 IU/L  ? AST 19 0 - 40 IU/L  ? ALT 33 (H) 0 - 32 IU/L  ? ?   ?Assessment & Plan:  ? ?Problem List Items Addressed This Visit   ? ?  ? Other  ? Obesity (BMI 30-39.9) - Primary  ?  Chronic. Will start Wegovy.  Discussed with patient side effects and benefits of medication.  Discussed how to properly titrate medication.  Will follow up with patient in 1 month. ?  ?  ? Relevant Medications  ? Semaglutide-Weight Management 0.25 MG/0.5ML SOAJ  ? Semaglutide-Weight Management 1.7 MG/0.75ML SOAJ (Start on 07/21/2021)  ? Semaglutide-Weight Management 2.4 MG/0.75ML SOAJ (Start on 08/19/2021)  ? Semaglutide-Weight Management 0.5 MG/0.5ML SOAJ (Start on 05/24/2021)  ? Semaglutide-Weight Management 1 MG/0.5ML SOAJ (Start on 06/22/2021)  ?  ? ?Follow up plan: ?Return in about 1 month (around 05/26/2021) for Weight Managment. ? ? ? ? ? ?

## 2021-04-25 NOTE — Telephone Encounter (Signed)
Requested medication (s) are due for refill today: no ? ?Requested medication (s) are on the active medication list: yes ? ?Last refill:   ?2 mL 0 04/25/2021  ? ?Future visit scheduled: yes ? ?Notes to clinic:  PA is needed for this rx. Please advise ? ? ?  ?Requested Prescriptions  ?Pending Prescriptions Disp Refills  ? WEGOVY 2.4 MG/0.75ML SOAJ [Pharmacy Med Name: WEGOVY 2.4 MG/0.75 ML PEN]  0  ?  Sig: INJECT 2.4 MG INTO THE SKIN ONCE A WEEK FOR 28 DAYS.  ?  ? Endocrinology:  Diabetes - GLP-1 Receptor Agonists - semaglutide Failed - 04/25/2021  4:11 PM  ?  ?  Failed - HBA1C in normal range and within 180 days  ?  Hgb A1c MFr Bld  ?Date Value Ref Range Status  ?02/28/2020 5.2 4.8 - 5.6 % Final  ?  Comment:  ?           Prediabetes: 5.7 - 6.4 ?         Diabetes: >6.4 ?         Glycemic control for adults with diabetes: <7.0 ?  ?  ?  ?  ?  Passed - Cr in normal range and within 360 days  ?  Creatinine, Ser  ?Date Value Ref Range Status  ?01/13/2021 0.67 0.57 - 1.00 mg/dL Final  ?  ?  ?  ?  Passed - Valid encounter within last 6 months  ?  Recent Outpatient Visits   ? ?      ? Today Obesity (BMI 30-39.9)  ? [facility] Larae Grooms, NP  ? 2 months ago Obesity (BMI 30-39.9)  ? [facility] Larae Grooms, NP  ? 3 months ago Obesity (BMI 30-39.9)  ? [facility] Larae Grooms, NP  ? 3 months ago Obesity (BMI 30-39.9)  ? [facility] Larae Grooms, NP  ? ?  ?  ?Future Appointments   ? ?        ? In 1 month Larae Grooms, NP [facility]  ? ?  ? ?  ?  ?  ? ?

## 2021-04-25 NOTE — Assessment & Plan Note (Signed)
Chronic. Will start Wegovy.  Discussed with patient side effects and benefits of medication.  Discussed how to properly titrate medication.  Will follow up with patient in 1 month. ?

## 2021-05-01 ENCOUNTER — Other Ambulatory Visit: Payer: Self-pay | Admitting: Nurse Practitioner

## 2021-05-01 NOTE — Telephone Encounter (Signed)
Requested medication (s) are due for refill today: no ? ?Requested medication (s) are on the active medication list: yes ? ?Last refill:  04/25/21 ? ?Future visit scheduled: yes ? ?Notes to clinic:  PA is needed for rx ? ? ?  ?Requested Prescriptions  ?Pending Prescriptions Disp Refills  ? WEGOVY 0.25 MG/0.5ML SOAJ [Pharmacy Med Name: WEGOVY 0.25 MG/0.5 ML PEN]  0  ?  Sig: INJECT 0.25 MG INTO THE SKIN ONCE A WEEK FOR 28 DAYS  ?  ? Endocrinology:  Diabetes - GLP-1 Receptor Agonists - semaglutide Failed - 05/01/2021  2:09 PM  ?  ?  Failed - HBA1C in normal range and within 180 days  ?  Hgb A1c MFr Bld  ?Date Value Ref Range Status  ?02/28/2020 5.2 4.8 - 5.6 % Final  ?  Comment:  ?           Prediabetes: 5.7 - 6.4 ?         Diabetes: >6.4 ?         Glycemic control for adults with diabetes: <7.0 ?  ?  ?  ?  ?  Passed - Cr in normal range and within 360 days  ?  Creatinine, Ser  ?Date Value Ref Range Status  ?01/13/2021 0.67 0.57 - 1.00 mg/dL Final  ?  ?  ?  ?  Passed - Valid encounter within last 6 months  ?  Recent Outpatient Visits   ? ?      ? 6 days ago Obesity (BMI 30-39.9)  ? [place], NP  ? 2 months ago Obesity (BMI 30-39.9)  ? [place], NP  ? 3 months ago Obesity (BMI 30-39.9)  ? [place], NP  ? 3 months ago Obesity (BMI 30-39.9)  ? [facility] Jon Billings, NP  ? ?  ?  ?Future Appointments   ? ?        ? In 4 weeks Jon Billings, NP [facility]  ? ?  ? ?  ?  ?  ? ?

## 2021-05-05 NOTE — Telephone Encounter (Signed)
PA initiated and submitted via Cover My Meds. Key: [id number]

## 2021-05-08 NOTE — Telephone Encounter (Signed)
PA denied. Appeal form completed and faxed in attempting to get approval for medication.  ?

## 2021-05-17 ENCOUNTER — Other Ambulatory Visit: Payer: Self-pay | Admitting: Nurse Practitioner

## 2021-05-19 NOTE — Telephone Encounter (Signed)
Requested medication (s) are due for refill today: Yes ? ?Requested medication (s) are on the active medication list: Yes ? ?Last refill:  3 weeks ago ? ?Future visit scheduled: No ? ?Notes to clinic:  PA is needed, alternative requested per pharmacy, see Rx highlighted  ? ? ? ? ? ?Requested Prescriptions  ?Pending Prescriptions Disp Refills  ? WEGOVY 1 MG/0.5ML SOAJ [Pharmacy Med Name: WEGOVY 1 MG/0.5 ML PEN]  0  ?  Sig: INJECT 1 MG INTO THE SKIN ONCE A WEEK FOR 28 DAYS.  ?  ? Endocrinology:  Diabetes - GLP-1 Receptor Agonists - semaglutide Failed - 05/17/2021  8:58 AM  ?  ?  Failed - HBA1C in normal range and within 180 days  ?  Hgb A1c MFr Bld  ?Date Value Ref Range Status  ?02/28/2020 5.2 4.8 - 5.6 % Final  ?  Comment:  ?           Prediabetes: 5.7 - 6.4 ?         Diabetes: >6.4 ?         Glycemic control for adults with diabetes: <7.0 ?  ?  ?  ?  ?  Passed - Cr in normal range and within 360 days  ?  Creatinine, Ser  ?Date Value Ref Range Status  ?01/13/2021 0.67 0.57 - 1.00 mg/dL Final  ?  ?  ?  ?  Passed - Valid encounter within last 6 months  ?  Recent Outpatient Visits   ? ?      ? 3 weeks ago Obesity (BMI 30-39.9)  ? [facility] Larae Grooms, NP  ? 2 months ago Obesity (BMI 30-39.9)  ? [facility] Larae Grooms, NP  ? 3 months ago Obesity (BMI 30-39.9)  ? [facility] Larae Grooms, NP  ? 4 months ago Obesity (BMI 30-39.9)  ? [facility] Larae Grooms, NP  ? ?  ?  ? ?  ?  ?  ? ? ? ? ?

## 2021-05-29 ENCOUNTER — Ambulatory Visit: Payer: 59 | Admitting: Nurse Practitioner

## 2021-06-09 ENCOUNTER — Encounter: Payer: Self-pay | Admitting: Nurse Practitioner

## 2022-10-01 ENCOUNTER — Ambulatory Visit: Payer: 59 | Admitting: Physician Assistant

## 2022-10-23 ENCOUNTER — Encounter: Payer: Self-pay | Admitting: Physician Assistant

## 2022-10-23 ENCOUNTER — Ambulatory Visit (INDEPENDENT_AMBULATORY_CARE_PROVIDER_SITE_OTHER): Payer: 59 | Admitting: Physician Assistant

## 2022-10-23 VITALS — BP 105/72 | HR 79 | Ht 63.0 in | Wt 201.6 lb

## 2022-10-23 DIAGNOSIS — Z0000 Encounter for general adult medical examination without abnormal findings: Secondary | ICD-10-CM

## 2022-10-23 DIAGNOSIS — Z136 Encounter for screening for cardiovascular disorders: Secondary | ICD-10-CM

## 2022-10-23 DIAGNOSIS — Z113 Encounter for screening for infections with a predominantly sexual mode of transmission: Secondary | ICD-10-CM

## 2022-10-23 DIAGNOSIS — Z114 Encounter for screening for human immunodeficiency virus [HIV]: Secondary | ICD-10-CM

## 2022-10-23 DIAGNOSIS — B9689 Other specified bacterial agents as the cause of diseases classified elsewhere: Secondary | ICD-10-CM

## 2022-10-23 DIAGNOSIS — Z7189 Other specified counseling: Secondary | ICD-10-CM | POA: Insufficient documentation

## 2022-10-23 DIAGNOSIS — Z1159 Encounter for screening for other viral diseases: Secondary | ICD-10-CM

## 2022-10-23 LAB — WET PREP FOR TRICH, YEAST, CLUE
Clue Cell Exam: POSITIVE — AB
Trichomonas Exam: NEGATIVE
Yeast Exam: NEGATIVE

## 2022-10-23 MED ORDER — METRONIDAZOLE 500 MG PO TABS
500.0000 mg | ORAL_TABLET | Freq: Two times a day (BID) | ORAL | 0 refills | Status: AC
Start: 2022-10-23 — End: ?

## 2022-10-23 NOTE — Progress Notes (Signed)
Your cervicovaginal swab was positive for bacterial vaginosis.  I have sent in a script for Flagyl to be taken by mouth twice per day for 7 days. Please finish the entire course unless you are instructed to stop or develop an allergic reaction. Please note that this medication can cause severe nausea and vomiting if alcohol is consumed while you are taking it so please refrain from this during the week you are on it. Please let us know if you have further questions or concerns.

## 2022-10-23 NOTE — Patient Instructions (Signed)
It was nice to meet you and I appreciate the opportunity to be involved in your care If you were satisfied with the care you received from me, I would greatly appreciate you saying so in the after-visit survey that is sent out following our visit.   

## 2022-10-23 NOTE — Addendum Note (Signed)
Addended by: Jacquelin Hawking on: 10/23/2022 05:09 PM   Modules accepted: Orders

## 2022-10-23 NOTE — Assessment & Plan Note (Signed)

## 2022-10-23 NOTE — Progress Notes (Signed)
Annual Physical Exam   Name: Suzanne Gonzales   MRN: [id number]    DOB: [birth year]   Date:10/23/2022  Today's Provider: Jacquelin Hawking, MHS, PA-C Introduced myself to the patient as a PA-C and provided education on APPs in clinical practice.         Subjective  Chief Complaint  Chief Complaint  Patient presents with   Annual Exam    HPI  Patient presents for annual CPE.  Diet: overall she thinks her diet is well balanced. She is working with a nutritionist to help with this  Exercise: She is trying to exercise but this a struggle due to her work schedule. She tries to walk several times per week for about a mile   Sleep: "it's not bad" she gets 8-10 hours per sleep. 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  Mood: "overall it's fine"    Depression: Phq 9 is  positive    10/23/2022    2:42 PM 02/19/2021    3:29 PM 01/22/2021    3:52 PM 01/03/2021    2:57 PM  Depression screen PHQ 2/9  Decreased Interest 0 2 0 0  Down, Depressed, Hopeless 0 1 0 0  PHQ - 2 Score 0 3 0 0  Altered sleeping 2 0 1 0  Tired, decreased energy 2 0 2 0  Change in appetite 2 0 1 0  Feeling bad or failure about yourself  0 0 0 0  Trouble concentrating 2 0 1 0  Moving slowly or fidgety/restless 0 0 0 0  Suicidal thoughts 0 0 0 0  PHQ-9 Score 8 3 5  0  Difficult doing work/chores Somewhat difficult Not difficult at all Somewhat difficult Not difficult at all   Hypertension: BP Readings from Last 3 Encounters:  10/23/22 105/72  04/25/21 111/72  02/19/21 109/71   Obesity: Wt Readings from Last 3 Encounters:  10/23/22 201 lb 9.6 oz (91.4 kg)  04/25/21 184 lb 3.2 oz (83.6 kg)  02/19/21 188 lb 9.6 oz (85.5 kg)   BMI Readings from Last 3 Encounters:  10/23/22 35.71 kg/m  04/25/21 32.63 kg/m  02/19/21 33.41 kg/m     Vaccines:   HPV: discussed vaccine series- offered to start these today, she declines today but thinks she got these when she was younger - she is  originally from [place] so may not be able to check vaccine records  Tdap: Overdue, offered today - patient declined  Shingrix: NA Pneumonia: NA Flu: postponed until fall  COVID-19: Discussed vaccine and booster recommendations per available CDC guidelines     Hep C Screening: ordered today  HIV screening: ordered today  STD testing and prevention (HIV/chl/gon/syphilis): she is amenable to screening today  Intimate partner violence: negative Sexual History: she is sexually active with single monogamous female partner  Menstrual History/LMP/Abnormal Bleeding: getting regular periods, LMP was 10/16/22 Discussed importance of follow up if any post-menopausal bleeding: not applicable Incontinence Symptoms: No.  Breast cancer hx:  - Last Mammogram: she reports both sides of her family have hx. Paternal grandmother and aunt - diagnosed in 27s and 7s. Recommend routine screening at age 36 for now  - BRCA gene screening:   Osteoporosis Prevention : Discussed high calcium and vitamin D supplementation, weight bearing exercises Bone density :not applicable  Cervical cancer screening: UTD - due next year   Skin cancer: Discussed monitoring for atypical lesions  Colorectal cancer screening: She denies known family hx -  recommend routine screening at age 49    Lung cancer:  Low Dose CT Chest recommended if Age 41-80 years, 20 pack-year currently smoking OR have quit w/in 15years. Patient does not qualify.   ECG: NA  Advanced Care Planning: A voluntary discussion about advance care planning including the explanation and discussion of advance directives.  Discussed health care proxy and Living will, and the patient was able to identify a health care proxy as no one . Patient does not have a living will in effect.  Lipids: Lab Results  Component Value Date   CHOL 189 02/28/2020   Lab Results  Component Value Date   HDL 54 02/28/2020   Lab Results  Component Value Date   LDLCALC 117 (H)  02/28/2020   Lab Results  Component Value Date   TRIG 99 02/28/2020   Lab Results  Component Value Date   CHOLHDL 3.5 02/28/2020   No results found for: "LDLDIRECT"  Glucose: Glucose  Date Value Ref Range Status  01/13/2021 84 70 - 99 mg/dL Final  30/16/0109 91 65 - 99 mg/dL Final    Patient Active Problem List   Diagnosis Date Noted   Advanced care planning/counseling discussion 10/23/2022   Obesity (BMI 30-39.9) 01/03/2021   Dysmenorrhea treated with oral contraceptive 05/30/2020   Anxiety 05/30/2020    Past Surgical History:  Procedure Laterality Date   COLPOSCOPY      Family History  Problem Relation Age of Onset   Ovarian cancer Mother    Breast cancer Maternal Grandmother    Breast cancer Paternal Grandmother     Social History   Socioeconomic History   Marital status: Single    Spouse name: Not on file   Number of children: Not on file   Years of education: Not on file   Highest education level: Not on file  Occupational History   Not on file  Tobacco Use   Smoking status: Never    Passive exposure: Past   Smokeless tobacco: Never  Vaping Use   Vaping status: Never Used  Substance and Sexual Activity   Alcohol use: Not Currently   Drug use: Not Currently   Sexual activity: Yes    Birth control/protection: Condom  Other Topics Concern   Not on file  Social History Narrative   Not on file   Social Determinants of Health   Financial Resource Strain: Not on file  Food Insecurity: Not on file  Transportation Needs: Not on file  Physical Activity: Not on file  Stress: Not on file  Social Connections: Not on file  Intimate Partner Violence: Not on file     Current Outpatient Medications:    propranolol (INDERAL) 20 MG tablet, Take 1 tablet (20 mg total) by mouth 2 (two) times daily., Disp: 60 tablet, Rfl: 3   sertraline (ZOLOFT) 50 MG tablet, Take 50 mg by mouth daily., Disp: , Rfl:    Naltrexone-buPROPion HCl ER 8-90 MG TB12, Start 1  tablet every morning for 7 days, then 1 tablet twice daily for 7 days, then 2 tablets every morning and one in the evening for 7 days, then increase to 2 tabs twice daily (Patient not taking: Reported on 04/25/2021), Disp: 120 tablet, Rfl: 0  No Known Allergies   Review of Systems  Constitutional:  Negative for chills, fever, malaise/fatigue and weight loss.  HENT:  Negative for hearing loss, nosebleeds, sore throat and tinnitus.   Eyes:  Negative for blurred vision, double vision and photophobia.  Respiratory:  Negative for shortness of breath.   Cardiovascular:  Negative for chest pain, palpitations and leg swelling.  Gastrointestinal:  Negative for blood in stool, constipation, diarrhea, heartburn, nausea and vomiting.  Genitourinary:  Negative for dysuria.  Musculoskeletal:  Negative for falls, joint pain and myalgias.  Skin:  Negative for itching and rash.  Neurological:  Negative for dizziness, tingling, tremors, loss of consciousness and headaches.  Psychiatric/Behavioral:  Negative for depression, memory loss, substance abuse and suicidal ideas. The patient is not nervous/anxious.       Objective  Vitals:   10/23/22 1438  BP: 105/72  Pulse: 79  SpO2: 98%  Weight: 201 lb 9.6 oz (91.4 kg)  Height: 5\' 3"  (1.6 m)    Body mass index is 35.71 kg/m.  Physical Exam Vitals reviewed.  Constitutional:      General: She is awake.     Appearance: Normal appearance. She is well-developed and well-groomed.  HENT:     Head: Normocephalic and atraumatic.  Eyes:     General: Lids are normal. Gaze aligned appropriately.     Extraocular Movements: Extraocular movements intact.     Conjunctiva/sclera: Conjunctivae normal.     Pupils: Pupils are equal, round, and reactive to light.  Neck:     Thyroid: No thyroid mass, thyromegaly or thyroid tenderness.  Cardiovascular:     Rate and Rhythm: Normal rate and regular rhythm.     Pulses: Normal pulses.          Radial pulses are 2+ on  the right side and 2+ on the left side.       Dorsalis pedis pulses are 2+ on the right side and 2+ on the left side.     Heart sounds: Normal heart sounds. No murmur heard.    No friction rub. No gallop.  Pulmonary:     Effort: Pulmonary effort is normal.     Breath sounds: Normal breath sounds. No decreased air movement. No decreased breath sounds, wheezing, rhonchi or rales.  Abdominal:     General: Abdomen is flat. Bowel sounds are normal.     Palpations: Abdomen is soft.     Tenderness: There is no abdominal tenderness.  Musculoskeletal:     Cervical back: Normal range of motion and neck supple.     Right lower leg: No edema.     Left lower leg: No edema.  Lymphadenopathy:     Head:     Right side of head: No submental, submandibular or preauricular adenopathy.     Left side of head: No submental, submandibular or preauricular adenopathy.     Cervical:     Right cervical: No superficial or posterior cervical adenopathy.    Left cervical: No superficial or posterior cervical adenopathy.     Upper Body:     Right upper body: No supraclavicular adenopathy.     Left upper body: No supraclavicular adenopathy.  Neurological:     General: No focal deficit present.     Mental Status: She is alert and oriented to person, place, and time.     GCS: GCS eye subscore is 4. GCS verbal subscore is 5. GCS motor subscore is 6.     Cranial Nerves: No cranial nerve deficit, dysarthria or facial asymmetry.     Motor: No weakness, tremor, atrophy or abnormal muscle tone.     Gait: Gait is intact.  Psychiatric:        Attention and Perception: Attention and perception normal.  Mood and Affect: Mood and affect normal.        Speech: Speech normal.        Behavior: Behavior normal. Behavior is cooperative.        Thought Content: Thought content normal.        Cognition and Memory: Cognition and memory normal.        Judgment: Judgment normal.      No results found for this or any  previous visit (from the past 2160 hour(s)).   Fall Risk:    10/23/2022    2:42 PM 02/19/2021    3:29 PM 01/22/2021    3:51 PM 01/03/2021    2:57 PM  Fall Risk   Falls in the past year? 0 0 0 0  Number falls in past yr: 0 0 0 0  Injury with Fall? 0 0 0 0  Risk for fall due to : No Fall Risks No Fall Risks No Fall Risks No Fall Risks  Follow up Falls evaluation completed Falls evaluation completed Falls evaluation completed Falls evaluation completed     Functional Status Survey:     Assessment & Plan  Problem List Items Addressed This Visit       Other   Advanced care planning/counseling discussion    A voluntary discussion about advance care planning including the explanation and discussion of advance directives was extensively discussed  with the patient for 5 minutes with patient and myself present.  Explanation about the health care proxy and Living will was reviewed and packet with forms with explanation of how to fill them out was given.  During this discussion, the patient was not able to identify a health care proxy and plans to fill out the paperwork required.  Patient was offered a separate Advance Care Planning visit for further assistance with forms.         Other Visit Diagnoses     Annual physical exam    -  Primary -USPSTF grade A and B recommendations reviewed with patient; age-appropriate recommendations, preventive care, screening tests, etc discussed and encouraged; healthy living encouraged; see AVS for patient education given to patient -Discussed importance of 150 minutes of physical activity weekly, eat two servings of fish weekly, eat one serving of tree nuts ( cashews, pistachios, pecans, almonds.Marland Kitchen) every other day, eat 6 servings of fruit/vegetables daily and drink plenty of water and avoid sweet beverages.   -Reviewed Health Maintenance: Yes.     Relevant Orders   Comp Met (CMET)   CBC w/Diff   HgB A1c   Lipid Profile   TSH   HIV antibody  (with reflex)   Hepatitis C antibody   Screening for HIV (human immunodeficiency virus)       Relevant Orders   HIV antibody (with reflex)   Encounter for hepatitis C screening test for low risk patient       Relevant Orders   Hepatitis C antibody   Screening for ischemic heart disease       Relevant Orders   Lipid Profile   Screening examination for STD (sexually transmitted disease)       Relevant Orders   HIV antibody (with reflex)   RPR w/reflex to TrepSure   GC/Chlamydia Probe Amp   WET PREP FOR TRICH, YEAST, CLUE        No follow-ups on file.   I, Edelin Fryer E Kolbey Teichert, PA-C, have reviewed all documentation for this visit. The documentation on 10/23/22 for the exam, diagnosis, procedures, and orders  are all accurate and complete.   Jacquelin Hawking, MHS, PA-C [facility]

## 2022-10-27 LAB — CBC WITH DIFFERENTIAL/PLATELET
Basophils Absolute: 0 10*3/uL (ref 0.0–0.2)
Basos: 0 %
EOS (ABSOLUTE): 0.2 10*3/uL (ref 0.0–0.4)
Eos: 2 %
Hematocrit: 42.6 % (ref 34.0–46.6)
Hemoglobin: 13.5 g/dL (ref 11.1–15.9)
Immature Grans (Abs): 0 10*3/uL (ref 0.0–0.1)
Immature Granulocytes: 0 %
Lymphocytes Absolute: 4.1 10*3/uL — ABNORMAL HIGH (ref 0.7–3.1)
Lymphs: 43 %
MCH: 29.2 pg (ref 26.6–33.0)
MCHC: 31.7 g/dL (ref 31.5–35.7)
MCV: 92 fL (ref 79–97)
Monocytes Absolute: 0.8 10*3/uL (ref 0.1–0.9)
Monocytes: 9 %
Neutrophils Absolute: 4.4 10*3/uL (ref 1.4–7.0)
Neutrophils: 46 %
Platelets: 362 10*3/uL (ref 150–450)
RBC: 4.62 x10E6/uL (ref 3.77–5.28)
RDW: 12.5 % (ref 11.7–15.4)
WBC: 9.5 10*3/uL (ref 3.4–10.8)

## 2022-10-27 LAB — LIPID PANEL
Chol/HDL Ratio: 4 ratio (ref 0.0–4.4)
Cholesterol, Total: 190 mg/dL (ref 100–199)
HDL: 47 mg/dL (ref >39)
LDL Chol Calc (NIH): 116 mg/dL — ABNORMAL HIGH (ref 0–99)
Triglycerides: 155 mg/dL — ABNORMAL HIGH (ref 0–149)
VLDL Cholesterol Cal: 27 mg/dL (ref 5–40)

## 2022-10-27 LAB — HEMOGLOBIN A1C
Est. average glucose Bld gHb Est-mCnc: 105 mg/dL
Hgb A1c MFr Bld: 5.3 % (ref 4.8–5.6)

## 2022-10-27 LAB — COMPREHENSIVE METABOLIC PANEL
ALT: 13 IU/L (ref 0–32)
AST: 13 IU/L (ref 0–40)
Albumin: 4.6 g/dL (ref 3.9–4.9)
Alkaline Phosphatase: 59 IU/L (ref 44–121)
BUN/Creatinine Ratio: 18 (ref 9–23)
BUN: 13 mg/dL (ref 6–20)
Bilirubin Total: 0.4 mg/dL (ref 0.0–1.2)
CO2: 23 mmol/L (ref 20–29)
Calcium: 9.6 mg/dL (ref 8.7–10.2)
Chloride: 102 mmol/L (ref 96–106)
Creatinine, Ser: 0.74 mg/dL (ref 0.57–1.00)
Globulin, Total: 2.5 g/dL (ref 1.5–4.5)
Glucose: 89 mg/dL (ref 70–99)
Potassium: 4 mmol/L (ref 3.5–5.2)
Sodium: 140 mmol/L (ref 134–144)
Total Protein: 7.1 g/dL (ref 6.0–8.5)
eGFR: 109 mL/min/{1.73_m2} (ref >59)

## 2022-10-27 LAB — RPR W/REFLEX TO TREPSURE: RPR: NONREACTIVE

## 2022-10-27 LAB — TREPONEMAL ANTIBODIES, TPPA: Treponemal Antibodies, TPPA: NONREACTIVE

## 2022-10-27 LAB — TSH: TSH: 1.67 u[IU]/mL (ref 0.450–4.500)

## 2022-10-27 LAB — HEPATITIS C ANTIBODY: Hep C Virus Ab: NONREACTIVE

## 2022-10-27 LAB — HIV ANTIBODY (ROUTINE TESTING W REFLEX): HIV Screen 4th Generation wRfx: NONREACTIVE

## 2022-10-28 LAB — GC/CHLAMYDIA PROBE AMP
Chlamydia trachomatis, NAA: NEGATIVE
Neisseria Gonorrhoeae by PCR: NEGATIVE

## 2022-10-29 NOTE — Progress Notes (Signed)
Labs are normal/stable with the exception of your cholesterol This has increased from the last time it was checked. I recommend reducing your intake of saturated fats and exercising regularly to help manage this for now. We can recheck in 6 months to make sure this is improving your levels. Please let us know if you have further questions or concerns.

## 2023-07-12 ENCOUNTER — Ambulatory Visit: Admitting: Nurse Practitioner

## 2023-07-12 ENCOUNTER — Ambulatory Visit: Payer: Self-pay

## 2023-07-12 ENCOUNTER — Encounter: Payer: Self-pay | Admitting: Nurse Practitioner

## 2023-07-12 VITALS — BP 108/74 | HR 102 | Temp 98.1°F | Resp 15 | Ht 62.99 in | Wt 167.0 lb

## 2023-07-12 DIAGNOSIS — R051 Acute cough: Secondary | ICD-10-CM | POA: Insufficient documentation

## 2023-07-12 MED ORDER — HYDROCOD POLI-CHLORPHE POLI ER 10-8 MG/5ML PO SUER
5.0000 mL | Freq: Every evening | ORAL | 0 refills | Status: AC | PRN
Start: 2023-07-12 — End: ?

## 2023-07-12 MED ORDER — ALBUTEROL SULFATE HFA 108 (90 BASE) MCG/ACT IN AERS: 2.0000 | INHALATION_SPRAY | Freq: Four times a day (QID) | RESPIRATORY_TRACT | 0 refills | Status: DC | PRN

## 2023-07-12 MED ORDER — PREDNISONE 20 MG PO TABS: 40.0000 mg | ORAL_TABLET | Freq: Every day | ORAL | 0 refills | Status: AC

## 2023-07-12 NOTE — Telephone Encounter (Signed)
  Chief Complaint: sore throat Symptoms: productive cough, chest pressure/tightness Frequency: x 1 week Pertinent Negatives: Patient denies fever, severe SOB, CP Disposition: [] ED /[] Urgent Care (no appt availability in office) / [x] Appointment(In office/virtual)/ []  [facility]/ [] Home Care/ [] Refused Recommended Disposition /[] [facility]/ []  Follow-up with PCP Additional Notes: Pt c/o sore throat, productive cough, and chest tightness/pressure r/t congestion/mucus. Pt recently went to UC without sx relief. Pt does endorse throat being "on fire". Scheduled patient per protocol on Jul 12, 2023 with alternate PCP. Patient verbalized understanding and to call back with worsening symptoms.      Copied from CRM [id number]. Topic: Clinical - Red Word Triage >> Jul 12, 2023  9:32 AM Hassie Lint wrote: Red Word that prompted transfer to Nurse Triage: Patient has been having sick symptoms for almost a week. Currently experiencing congestion, cough with discolored mucous, pressure in her chest - pain when coughs, low grade fever, difficulty sleeping. Tested negative for covid. Went urgent care, prescribed a medication, denzonatate 100mg  and azelastine(nasal spray) but doesn't feel like it is helping. Reason for Disposition  [1] SEVERE sore throat AND [2] present > 24 hours  Answer Assessment - Initial Assessment Questions 1. ONSET: "When did the nasal discharge start?"      X 1 week 2. AMOUNT: "How much discharge is there?"      Reports worse in AM 3. COUGH: "Do you have a cough?" If Yes, ask: "Describe the color of your sputum" (clear, white, yellow, green)     Yellow/green 4. RESPIRATORY DISTRESS: "Describe your breathing."      "My breathing is different...more labored" 5. FEVER: "Do you have a fever?" If Yes, ask: "What is your temperature, how was it measured, and when did it start?"     Denies today Endorses low grade the past few days 6. SEVERITY: "Overall, how bad are  you feeling right now?" (e.g., doesn't interfere with normal activities, staying home from school/work, staying in bed)      "Like I got hit by a bus" 7. OTHER SYMPTOMS: "Do you have any other symptoms?" (e.g., sore throat, earache, wheezing, vomiting)     Chest heaviness Denies current vomiting (last vomited Friday), "throat is on fire" Endorses COVID neg at home test 8. PREGNANCY: "Is there any chance you are pregnant?" "When was your last menstrual period?"     Denies, LMP now  Protocols used: Common Cold-A-AH

## 2023-07-12 NOTE — Assessment & Plan Note (Signed)
 Since 07/08/23 (5 days), Covid negative at home.  Discussed with patient that it is too early for abx use and suspect this is more viral, but if gets to day 8 or 9 to alert PCP and will send in abx at that time.  For now will send in Prednisone  40 MG daily for 5 days, Albuterol  PRN, and Tussionex for night time and rest + cough.  Recommend: - Increased rest - Increasing Fluids - Acetaminophen / ibuprofen as needed for fever/pain.  - Salt water gargling, chloraseptic spray and throat lozenges - Mucinex.  - Humidifying the air.

## 2023-07-12 NOTE — Progress Notes (Signed)
 BP 108/74 (BP Location: Left Arm, Patient Position: Sitting, Cuff Size: Normal)   Pulse (!) 102   Temp 98.1 F (36.7 C) (Oral)   Resp 15   Ht 5' 2.99" (1.6 m)   Wt 167 lb (75.8 kg)   LMP 07/11/2023 (Exact Date)   SpO2 98%   BMI 29.59 kg/m    Subjective:    Patient ID: Suzanne Gonzales, female    DOB: February 07, 1991, 33 y.o.   MRN: [id number]  HPI: Suzanne Gonzales is a 32 y.o. female  Chief Complaint  Patient presents with   Sore Throat    Low grade fever, no vomiting since Friday. Feels she can't sleep due to coughing up phlegm. UC meds are not helping yet.    UPPER RESPIRATORY TRACT INFECTION Started on Wednesday with sore throat, GI issues, + fever. Covid testing was negative. Went to urgent care yesterday.  Fever: no none since Friday Cough: yes Shortness of breath: yes Wheezing: yes Chest pain: no Chest tightness: yes Chest congestion: yes Nasal congestion: no Runny nose: no Post nasal drip: no Sneezing: no Sore throat: yes Swollen glands: no Sinus pressure: no Headache: yes Face pain: no Toothache: no Ear pain: none Ear pressure: none Eyes red/itching:no Eye drainage/crusting: no  Vomiting: no not since Friday Rash: no Fatigue: yes Sick contacts: no however just got back from cruise Strep contacts: no  Context: fluctuating Recurrent sinusitis: no Relief with OTC cold/cough medications: no  Treatments attempted:  nasal spray and Tessalon, Mucinex, Dayquil, Ibuprofen  Relevant past medical, surgical, family and social history reviewed and updated as indicated. Interim medical history since our last visit reviewed. Allergies and medications reviewed and updated.  Review of Systems  Constitutional:  Positive for fatigue and fever (improved). Negative for activity change, appetite change and chills.  HENT:  Positive for sore throat. Negative for congestion, ear discharge, ear pain, facial swelling, postnasal drip, rhinorrhea, sinus pressure, sinus  pain, sneezing and voice change.   Eyes:  Negative for pain and visual disturbance.  Respiratory:  Positive for cough, chest tightness, shortness of breath and wheezing.   Cardiovascular:  Negative for chest pain, palpitations and leg swelling.  Gastrointestinal: Negative.   Endocrine: Negative.   Neurological:  Positive for headaches. Negative for dizziness and numbness.  Psychiatric/Behavioral: Negative.     Per HPI unless specifically indicated above     Objective:     BP 108/74 (BP Location: Left Arm, Patient Position: Sitting, Cuff Size: Normal)   Pulse (!) 102   Temp 98.1 F (36.7 C) (Oral)   Resp 15   Ht 5' 2.99" (1.6 m)   Wt 167 lb (75.8 kg)   LMP 07/11/2023 (Exact Date)   SpO2 98%   BMI 29.59 kg/m   Wt Readings from Last 3 Encounters:  07/12/23 167 lb (75.8 kg)  10/23/22 201 lb 9.6 oz (91.4 kg)  04/25/21 184 lb 3.2 oz (83.6 kg)    Physical Exam Vitals and nursing note reviewed.  Constitutional:      General: She is awake. She is not in acute distress.    Appearance: She is well-developed and well-groomed. She is ill-appearing. She is not toxic-appearing.  HENT:     Head: Normocephalic.     Right Ear: Hearing, ear canal and external ear normal. A middle ear effusion is present. Tympanic membrane is not injected or perforated.     Left Ear: Hearing, ear canal and external ear normal. A middle ear effusion is present. Tympanic membrane is  not injected or perforated.     Nose: Nose normal. No rhinorrhea.     Right Sinus: No maxillary sinus tenderness or frontal sinus tenderness.     Left Sinus: No maxillary sinus tenderness or frontal sinus tenderness.     Mouth/Throat:     Mouth: Mucous membranes are moist.     Pharynx: Posterior oropharyngeal erythema (mild) present. No pharyngeal swelling or oropharyngeal exudate.  Eyes:     General: Lids are normal.        Right eye: No discharge.        Left eye: No discharge.     Conjunctiva/sclera: Conjunctivae normal.      Pupils: Pupils are equal, round, and reactive to light.  Neck:     Thyroid: No thyromegaly.     Vascular: No carotid bruit.  Cardiovascular:     Rate and Rhythm: Regular rhythm. Tachycardia present.     Heart sounds: Normal heart sounds. No murmur heard.    No gallop.  Pulmonary:     Effort: Pulmonary effort is normal. No accessory muscle usage or respiratory distress.     Breath sounds: Wheezing (intermittent expiratory throughout) present. No decreased breath sounds or rhonchi.  Abdominal:     General: Bowel sounds are normal.     Palpations: Abdomen is soft. There is no hepatomegaly or splenomegaly.  Musculoskeletal:     Cervical back: Normal range of motion and neck supple.     Right lower leg: No edema.     Left lower leg: No edema.  Lymphadenopathy:     Head:     Right side of head: No submental, submandibular, tonsillar, preauricular or posterior auricular adenopathy.     Left side of head: No submental, submandibular, tonsillar, preauricular or posterior auricular adenopathy.     Cervical: No cervical adenopathy.  Skin:    General: Skin is warm and dry.  Neurological:     Mental Status: She is alert and oriented to person, place, and time.  Psychiatric:        Attention and Perception: Attention normal.        Mood and Affect: Mood normal.        Speech: Speech normal.        Behavior: Behavior normal. Behavior is cooperative.        Thought Content: Thought content normal.     Results for orders placed or performed in visit on 10/28/22  GC/Chlamydia Probe Amp   Collection Time: 2022/10/28  3:24 PM   Specimen: Urine   UR  Result Value Ref Range   Chlamydia trachomatis, NAA Negative Negative   Neisseria Gonorrhoeae by PCR Negative Negative  WET PREP FOR TRICH, YEAST, CLUE   Collection Time: 10/28/22  3:24 PM   Specimen: Sterile Swab   Sterile Swab  Result Value Ref Range   Trichomonas Exam Negative Negative   Yeast Exam Negative Negative   Clue Cell Exam  Positive (A) Negative  Treponemal Antibodies, TPPA   Collection Time: 10-28-22  3:33 PM  Result Value Ref Range   Treponemal Antibodies, TPPA Non Reactive Non Reactive   Interpretation: Comment   Comp Met (CMET)   Collection Time: 2022/10/28  3:33 PM  Result Value Ref Range   Glucose 89 70 - 99 mg/dL   BUN 13 6 - 20 mg/dL   Creatinine, Ser 1.91 0.57 - 1.00 mg/dL   eGFR 478 >29 FA/OZH/0.86   BUN/Creatinine Ratio 18 9 - 23   Sodium 140 134 - 144  mmol/L   Potassium 4.0 3.5 - 5.2 mmol/L   Chloride 102 96 - 106 mmol/L   CO2 23 20 - 29 mmol/L   Calcium 9.6 8.7 - 10.2 mg/dL   Total Protein 7.1 6.0 - 8.5 g/dL   Albumin 4.6 3.9 - 4.9 g/dL   Globulin, Total 2.5 1.5 - 4.5 g/dL   Bilirubin Total 0.4 0.0 - 1.2 mg/dL   Alkaline Phosphatase 59 44 - 121 IU/L   AST 13 0 - 40 IU/L   ALT 13 0 - 32 IU/L  CBC w/Diff   Collection Time: 10/23/22  3:33 PM  Result Value Ref Range   WBC 9.5 3.4 - 10.8 x10E3/uL   RBC 4.62 3.77 - 5.28 x10E6/uL   Hemoglobin 13.5 11.1 - 15.9 g/dL   Hematocrit 16.1 09.6 - 46.6 %   MCV 92 79 - 97 fL   MCH 29.2 26.6 - 33.0 pg   MCHC 31.7 31.5 - 35.7 g/dL   RDW 04.5 40.9 - 81.1 %   Platelets 362 150 - 450 x10E3/uL   Neutrophils 46 Not Estab. %   Lymphs 43 Not Estab. %   Monocytes 9 Not Estab. %   Eos 2 Not Estab. %   Basos 0 Not Estab. %   Neutrophils Absolute 4.4 1.4 - 7.0 x10E3/uL   Lymphocytes Absolute 4.1 (H) 0.7 - 3.1 x10E3/uL   Monocytes Absolute 0.8 0.1 - 0.9 x10E3/uL   EOS (ABSOLUTE) 0.2 0.0 - 0.4 x10E3/uL   Basophils Absolute 0.0 0.0 - 0.2 x10E3/uL   Immature Granulocytes 0 Not Estab. %   Immature Grans (Abs) 0.0 0.0 - 0.1 x10E3/uL  HgB A1c   Collection Time: 10/23/22  3:33 PM  Result Value Ref Range   Hgb A1c MFr Bld 5.3 4.8 - 5.6 %   Est. average glucose Bld gHb Est-mCnc 105 mg/dL  Lipid Profile   Collection Time: 10/23/22  3:33 PM  Result Value Ref Range   Cholesterol, Total 190 100 - 199 mg/dL   Triglycerides 914 (H) 0 - 149 mg/dL   HDL 47 >78  mg/dL   VLDL Cholesterol Cal 27 5 - 40 mg/dL   LDL Chol Calc (NIH) 295 (H) 0 - 99 mg/dL   Chol/HDL Ratio 4.0 0.0 - 4.4 ratio  TSH   Collection Time: 10/23/22  3:33 PM  Result Value Ref Range   TSH 1.670 0.450 - 4.500 uIU/mL  HIV antibody (with reflex)   Collection Time: 10/23/22  3:33 PM  Result Value Ref Range   HIV Screen 4th Generation wRfx Non Reactive Non Reactive  Hepatitis C antibody   Collection Time: 10/23/22  3:33 PM  Result Value Ref Range   Hep C Virus Ab Non Reactive Non Reactive  RPR w/reflex to TrepSure   Collection Time: 10/23/22  3:33 PM  Result Value Ref Range   RPR Non Reactive Non Reactive      Assessment & Plan:   Problem List Items Addressed This Visit       Other   Acute cough - Primary   Since 07/08/23 (5 days), Covid negative at home.  Discussed with patient that it is too early for abx use and suspect this is more viral, but if gets to day 8 or 9 to alert PCP and will send in abx at that time.  For now will send in Prednisone  40 MG daily for 5 days, Albuterol  PRN, and Tussionex for night time and rest + cough.  Recommend: - Increased rest - Increasing  Fluids - Acetaminophen / ibuprofen as needed for fever/pain.  - Salt water gargling, chloraseptic spray and throat lozenges - Mucinex.  - Humidifying the air.         Follow up plan: Return if symptoms worsen or fail to improve.

## 2023-07-12 NOTE — Patient Instructions (Signed)

## 2023-08-03 ENCOUNTER — Other Ambulatory Visit: Payer: Self-pay | Admitting: Nurse Practitioner

## 2023-08-03 NOTE — Telephone Encounter (Signed)
 Requested Prescriptions  Pending Prescriptions Disp Refills   albuterol  (VENTOLIN  HFA) 108 (90 Base) MCG/ACT inhaler [Pharmacy Med Name: ALBUTEROL  HFA INH (200 PUFFS) 6.7GM] 6.7 g 0    Sig: INHALE 2 PUFFS INTO THE LUNGS EVERY 6 HOURS AS NEEDED FOR WHEEZING OR SHORTNESS OF BREATH     Pulmonology:  [facility] 2 Failed - 08/03/2023  4:24 PM      Failed - Valid encounter within last 12 months    Recent Outpatient Visits           3 weeks ago Acute cough   Suzanne Gonzales Suzanne Pyles, NP       Future Appointments             In 2 months Suzanne Alexanders, NP [facility]            Passed - Last BP in normal range    BP Readings from Last 1 Encounters:  07/12/23 108/74         Passed - Last Heart Rate in normal range    Pulse Readings from Last 1 Encounters:  07/12/23 (!) 102

## 2023-10-26 ENCOUNTER — Encounter: Payer: Self-pay | Admitting: Nurse Practitioner
